# Patient Record
Sex: Female | Born: 1980 | Race: White | Hispanic: No | Smoking: Never smoker
Health system: Southern US, Community
[De-identification: ages and names within clinical notes are randomized; demographics above are authoritative.]

## PROBLEM LIST (undated history)

## (undated) ENCOUNTER — Inpatient Hospital Stay (HOSPITAL_COMMUNITY): Payer: Self-pay

## (undated) DIAGNOSIS — I10 Essential (primary) hypertension: Secondary | ICD-10-CM

## (undated) DIAGNOSIS — F329 Major depressive disorder, single episode, unspecified: Secondary | ICD-10-CM

## (undated) DIAGNOSIS — R002 Palpitations: Secondary | ICD-10-CM

## (undated) DIAGNOSIS — F32A Depression, unspecified: Secondary | ICD-10-CM

## (undated) DIAGNOSIS — R011 Cardiac murmur, unspecified: Secondary | ICD-10-CM

## (undated) DIAGNOSIS — Z9851 Tubal ligation status: Secondary | ICD-10-CM

## (undated) DIAGNOSIS — Z8619 Personal history of other infectious and parasitic diseases: Secondary | ICD-10-CM

## (undated) DIAGNOSIS — D649 Anemia, unspecified: Secondary | ICD-10-CM

## (undated) DIAGNOSIS — Q8789 Other specified congenital malformation syndromes, not elsewhere classified: Secondary | ICD-10-CM

## (undated) HISTORY — DX: Depression, unspecified: F32.A

## (undated) HISTORY — DX: Palpitations: R00.2

## (undated) HISTORY — DX: Anemia, unspecified: D64.9

## (undated) HISTORY — PX: EYE SURGERY: SHX253

## (undated) HISTORY — DX: Other specified congenital malformation syndromes, not elsewhere classified: Q87.89

## (undated) HISTORY — DX: Major depressive disorder, single episode, unspecified: F32.9

## (undated) HISTORY — PX: WISDOM TOOTH EXTRACTION: SHX21

## (undated) HISTORY — DX: Essential (primary) hypertension: I10

## (undated) HISTORY — DX: Personal history of other infectious and parasitic diseases: Z86.19

## (undated) HISTORY — DX: Cardiac murmur, unspecified: R01.1

---
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
## 2006-07-18 ENCOUNTER — Emergency Department (HOSPITAL_COMMUNITY): Admission: EM | Admit: 2006-07-18 | Discharge: 2006-07-18 | Payer: Self-pay | Admitting: Emergency Medicine

## 2008-07-21 ENCOUNTER — Emergency Department (HOSPITAL_BASED_OUTPATIENT_CLINIC_OR_DEPARTMENT_OTHER): Admission: EM | Admit: 2008-07-21 | Discharge: 2008-07-21 | Payer: Self-pay | Admitting: Emergency Medicine

## 2008-07-23 ENCOUNTER — Emergency Department (HOSPITAL_BASED_OUTPATIENT_CLINIC_OR_DEPARTMENT_OTHER): Admission: EM | Admit: 2008-07-23 | Discharge: 2008-07-23 | Payer: Self-pay | Admitting: Emergency Medicine

## 2009-07-27 ENCOUNTER — Inpatient Hospital Stay (HOSPITAL_COMMUNITY): Admission: AD | Admit: 2009-07-27 | Discharge: 2009-07-27 | Payer: Self-pay | Admitting: Obstetrics

## 2009-12-06 ENCOUNTER — Inpatient Hospital Stay (HOSPITAL_COMMUNITY)
Admission: AD | Admit: 2009-12-06 | Discharge: 2009-12-07 | Payer: Self-pay | Source: Home / Self Care | Admitting: Obstetrics & Gynecology

## 2009-12-12 ENCOUNTER — Inpatient Hospital Stay (HOSPITAL_COMMUNITY)
Admission: AD | Admit: 2009-12-12 | Discharge: 2009-12-14 | Payer: Self-pay | Source: Home / Self Care | Admitting: Obstetrics & Gynecology

## 2010-03-27 ENCOUNTER — Encounter: Payer: Self-pay | Admitting: Family

## 2010-10-13 ENCOUNTER — Encounter: Payer: Self-pay | Admitting: Family

## 2010-10-13 ENCOUNTER — Ambulatory Visit (INDEPENDENT_AMBULATORY_CARE_PROVIDER_SITE_OTHER): Payer: PRIVATE HEALTH INSURANCE | Admitting: Family

## 2010-10-13 DIAGNOSIS — S8010XA Contusion of unspecified lower leg, initial encounter: Secondary | ICD-10-CM | POA: Insufficient documentation

## 2010-10-13 DIAGNOSIS — Z0000 Encounter for general adult medical examination without abnormal findings: Secondary | ICD-10-CM

## 2010-10-13 LAB — CBC
HCT: 26.8 % — ABNORMAL LOW (ref 36.0–46.0)
HCT: 35.3 % — ABNORMAL LOW (ref 36.0–46.0)
Hemoglobin: 12.3 g/dL (ref 12.0–15.0)
Hemoglobin: 9.6 g/dL — ABNORMAL LOW (ref 12.0–15.0)
MCHC: 34.9 g/dL (ref 30.0–36.0)
MCHC: 35.8 g/dL (ref 30.0–36.0)
MCV: 92 fL (ref 78.0–100.0)
MCV: 92.5 fL (ref 78.0–100.0)
Platelets: 207 10*3/uL (ref 150–400)
Platelets: 267 10*3/uL (ref 150–400)
RBC: 2.9 MIL/uL — ABNORMAL LOW (ref 3.87–5.11)
RBC: 3.83 MIL/uL — ABNORMAL LOW (ref 3.87–5.11)
RDW: 12.7 % (ref 11.5–15.5)
RDW: 12.9 % (ref 11.5–15.5)
WBC: 10.6 10*3/uL — ABNORMAL HIGH (ref 4.0–10.5)
WBC: 12.7 10*3/uL — ABNORMAL HIGH (ref 4.0–10.5)

## 2010-10-13 LAB — RPR: RPR Ser Ql: NONREACTIVE

## 2010-10-15 ENCOUNTER — Other Ambulatory Visit: Payer: Self-pay | Admitting: Family

## 2010-10-15 LAB — CBC WITH DIFFERENTIAL/PLATELET
Basophils Absolute: 0 10*3/uL (ref 0.0–0.1)
Basophils Relative: 0 % (ref 0–1)
Eosinophils Absolute: 0 10*3/uL (ref 0.0–0.7)
Eosinophils Relative: 0 % (ref 0–5)
HCT: 37.9 % (ref 36.0–46.0)
Hemoglobin: 12.4 g/dL (ref 12.0–15.0)
Lymphocytes Relative: 18 % (ref 12–46)
Lymphs Abs: 1.3 10*3/uL (ref 0.7–4.0)
MCH: 29.7 pg (ref 26.0–34.0)
MCHC: 32.7 g/dL (ref 30.0–36.0)
MCV: 90.7 fL (ref 78.0–100.0)
Monocytes Absolute: 0.6 10*3/uL (ref 0.1–1.0)
Monocytes Relative: 9 % (ref 3–12)
Neutro Abs: 5 10*3/uL (ref 1.7–7.7)
Neutrophils Relative %: 72 % (ref 43–77)
Platelets: 267 10*3/uL (ref 150–400)
RBC: 4.18 MIL/uL (ref 3.87–5.11)
RDW: 13 % (ref 11.5–15.5)
WBC: 7 10*3/uL (ref 4.0–10.5)

## 2010-10-15 LAB — BASIC METABOLIC PANEL
Calcium: 9.4 mg/dL (ref 8.4–10.5)
Glucose, Bld: 87 mg/dL (ref 70–99)
Sodium: 139 mEq/L (ref 135–145)

## 2010-10-15 LAB — HEPATIC FUNCTION PANEL
ALT: 14 U/L (ref 0–35)
AST: 15 U/L (ref 0–37)
Bilirubin, Direct: 0.2 mg/dL (ref 0.0–0.3)

## 2010-10-15 LAB — LIPID PANEL
Cholesterol: 140 mg/dL (ref 0–200)
HDL: 47 mg/dL (ref >39)
Total CHOL/HDL Ratio: 3 Ratio

## 2010-10-15 LAB — TSH: TSH: 0.703 u[IU]/mL (ref 0.350–4.500)

## 2010-10-16 ENCOUNTER — Encounter: Payer: Self-pay | Admitting: Family

## 2010-10-23 NOTE — Letter (Signed)
   [place] [facility] [address] [place], [place]  [address]  [place] Phone: [phone]      October 16, 2010   Jisselle [place] [address] [place], [place] [address]  RE:  LAB RESULTS  Dear  Ms. [place],  The following is an interpretation of your most recent lab tests.  Please take note of any instructions provided or changes to medications that have resulted from your lab work.  ELECTROLYTES:  Good - no changes needed  KIDNEY FUNCTION TESTS:  Good - no changes needed  LIVER FUNCTION TESTS:  Good - no changes needed  LIPID PANEL:  Good - no changes needed Triglyceride: 60   Cholesterol: 140   LDL: 81   HDL: 47   Chol/HDL%:  3.0 Ratio  THYROID STUDIES:  Thyroid studies normal TSH: 0.703     DIABETIC STUDIES:  Excellent - no changes needed Blood Glucose: 87    CBC:  Good - no changes needed   Sincerely Yours,    Lemont Fillers FNP  Appended Document:  Mailed.

## 2010-10-23 NOTE — Assessment & Plan Note (Signed)
Summary: new pt--rm 4   Vital Signs:  Patient profile:   30 year old female Menstrual status:  regular LMP:     09/29/2010 Height:      66 inches Weight:      132.25 pounds BMI:     21.42 Temp:     98.2 degrees F oral Pulse rate:   72 / minute Pulse rhythm:   regular Resp:     12 per minute BP sitting:   108 / 80  (right arm) Cuff size:   regular  Vitals Entered By: Mervin Kung CMA Duncan Dull) (October 13, 2010 1:47 PM) CC: Pt states she is here for a physical; non-fasting. Is Patient Diabetic? No Pain Assessment Patient in pain? no      LMP (date): 09/29/2010     Menstrual Status regular Enter LMP: 09/29/2010 Last PAP Result normal   CC:  Pt states she is here for a physical; non-fasting.Marland Kitchen  History of Present Illness: Patient is here today for a complete physical.  Has not had a PCP, has seen OB/GYN only doctor.   Preventative- Dr.  Steele Sizer.  Last pap smear was last August- sees Dr. Juliene Pina of [facility]-Has always had normal pap smears.  Last tetanus shot, 2003. Patient does regular exercise- boot camp and running.  Diet is "whatever I want."    Complains of bruising on her legs the last 2 weeks.  Shins hurt on/off.  Preventive Screening-Counseling & Management  Alcohol-Tobacco     Alcohol drinks/day: social; 2-3 a week     Alcohol type: beer and wine     Smoking Status: never  Caffeine-Diet-Exercise     Caffeine use/day: 1-2 daily     Does Patient Exercise: yes     Type of exercise: boot camp and running     Exercise (avg: min/session):     Times/week: 3      Drug Use:  no.    Allergies (verified): No Known Drug Allergies  Past History:  Past Medical History: history of chicken pox depression history of heart murmur heart palpatations history of anemia  Past Surgical History: Waardenburg Syndrome; bilateral eye surgery wisdom teeth extraction--30yrs old  Family History: MGF--heart disease MGM--stroke PGF--heart disease father--  a&w mother--living; back problems  2 children--a&W 1 son--deaf 1 daughter--a&w  Social History: Occupation:  stay at home mom mom is Cyndi Bender Divorced- engaged Never Smoked Alcohol use-yes once a week, up to 3 drinks at a time Regular exercise-yes Drug use-no Smoking Status:  never Caffeine use/day:  1-2 daily Does Patient Exercise:  yes Drug Use:  no  Review of Systems       Constitutional: Denies Fever ENT:  Denies nasal congestion or sore throat. Resp: Denies cough CV:  Denies Chest Pain or shortness of breath GI:  Denies nausea or vomitting GU: Denies dysuria Lymphatic: Denies lymphadenopathy Musculoskeletal:  some numbness of left shin last saturday x 2 hours, now resolved. Skin:  Denies Rashes Psychiatric: Denies depression currently, had some problems with depression as a teenager. 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   Neuro: see HPI     Physical Exam  General:  Wide set eyes,  awake, alert, NAD Head:  Frontal grey patch of hair. Normocephalic Eyes:  PERRLA, Right eye is light blue, left eye is brown. Ears:  External ear exam shows no significant lesions or deformities.  Otoscopic examination reveals clear canals, tympanic membranes  are intact bilaterally without bulging, retraction, inflammation or discharge. Hearing is grossly normal bilaterally. Mouth:  Oral mucosa and oropharynx without lesions or exudates.  Teeth in good repair. Neck:  No deformities, masses, or tenderness noted. Breasts:  No mass, nodules, thickening, tenderness, bulging, retraction, inflamation, nipple discharge or skin changes noted.   Lungs:  Normal respiratory effort, chest expands symmetrically. Lungs are clear to auscultation, no crackles or wheezes. Heart:  Normal rate and regular rhythm. S1 and S2 normal without gallop, murmur, click, rub or other extra sounds. Abdomen:  Bowel sounds positive,abdomen soft and  non-tender without masses, organomegaly or hernias noted. Genitalia:  deferred to GYN Pulses:  2+ DP/PT pulses bliaterally. Extremities:  No clubbing, cyanosis, edema, or deformity noted with normal full range of motion of all joints.   Neurologic:  Hearing impaired.  alert & oriented X3 and strength normal in all extremities.   Skin:  Intact without suspicious lesions or rashes.  + bruise noted on left anterior thigh. Cervical Nodes:  No lymphadenopathy noted Psych:  Cognition and judgment appear intact. Alert and cooperative with normal attention span and concentration. No apparent delusions, illusions, hallucinations   Impression & Recommendations:  Problem # 1:  Preventive Health Care (ICD-V70.0) Assessment Comment Only  Immunizations reviewed and up to date.  Reinforced her healthy exercise habits.   Pap up to date with GYN.  Orders: TLB-Lipid Panel (80061-LIPID) TLB-BMP (Basic Metabolic Panel-BMET) (80048-METABOL) TLB-CBC Platelet - w/Differential (85025-CBCD) TLB-Hepatic/Liver Function Pnl (80076-HEPATIC) TLB-TSH (Thyroid Stimulating Hormone) (84443-TSH)  Problem # 2:  CONTUSION, LOWER LEG, LEFT (ICD-924.10) Notes easy bruisbility.  Will check CBC given her history of anemia.   Patient Instructions: 1)  Keep up the good work with the exercise. 2)  Try to make sure that you are eating enough fruits/veggies, and at least 3 servings of dairy each day. 3)  Please go to the lab on the first floor tomorrow morning fasting to complete your blood work. 4)  Follow up in 1 year, sooner if problems or concerns.   Orders Added: 1)  TLB-Lipid Panel [80061-LIPID] 2)  TLB-BMP (Basic Metabolic Panel-BMET) [80048-METABOL] 3)  TLB-CBC Platelet - w/Differential [85025-CBCD] 4)  TLB-Hepatic/Liver Function Pnl [80076-HEPATIC] 5)  TLB-TSH (Thyroid Stimulating Hormone) [84443-TSH] 6)  New Patient 18-39 years [99385]     Preventive Care Screening  Pap Smear:    Date:  03/27/2010     Results:  normal   Last Tetanus Booster:    Date:  07/27/2001    Results:  Historical    Current Allergies (reviewed today): No known allergies

## 2010-11-18 ENCOUNTER — Encounter: Payer: Self-pay | Admitting: Family

## 2010-11-19 ENCOUNTER — Ambulatory Visit (INDEPENDENT_AMBULATORY_CARE_PROVIDER_SITE_OTHER): Payer: PRIVATE HEALTH INSURANCE | Admitting: Family

## 2010-11-19 ENCOUNTER — Encounter: Payer: Self-pay | Admitting: Family

## 2010-11-19 DIAGNOSIS — R0789 Other chest pain: Secondary | ICD-10-CM

## 2010-11-19 DIAGNOSIS — S60229A Contusion of unspecified hand, initial encounter: Secondary | ICD-10-CM

## 2010-11-19 DIAGNOSIS — I998 Other disorder of circulatory system: Secondary | ICD-10-CM

## 2010-11-19 DIAGNOSIS — R58 Hemorrhage, not elsewhere classified: Secondary | ICD-10-CM

## 2010-11-19 DIAGNOSIS — R079 Chest pain, unspecified: Secondary | ICD-10-CM

## 2010-11-19 LAB — PROTIME-INR: Prothrombin Time: 14.3 seconds (ref 11.6–15.2)

## 2010-11-19 NOTE — Progress Notes (Signed)
  Subjective:    Patient ID: Taylor Ferguson, female    DOB: Apr 24, 1981, 30 y.o.   MRN: [id number]  Shortness of Breath Pertinent negatives include no fever.    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  She has been running about 4 days a week for the last 1 year. Notes that she started having chest pain, everything started getting dark, had trouble breathing.    She notes that the chest pain was in the left upper chest and then is spread around the right to her back.  This lasted for about 1 hour.  She had associated shortness of breath off and on 6x a day for about 1 minute-  at a time.  She denies hx of asthma.  Today she reports feeling tired. She notes that she had partial loss of vision- felt like a I was going to pass out.  She stopped running.  She also reports some associated dizziness.  Since Saturday she has run several times without any further chest pain or shortness of breath.  Ecchymosis- notes that she continues to notice bruising on her legs x 2 months.   A sign language interpreter assisted throughout the visit.  Review of Systems  Constitutional: Negative for fever.  Respiratory: Positive for shortness of breath. Negative for cough.   Cardiovascular: Negative for palpitations.  Neurological: Positive for dizziness.       Objective:   Physical Exam  Constitutional: She appears well-developed and well-nourished.  HENT:  Head: Normocephalic.  Right Ear: Tympanic membrane normal.  Left Ear: Tympanic membrane normal.  Eyes: Conjunctivae are normal. Pupils are equal, round, and reactive to light.  Neck: Normal range of motion. Neck supple.  Cardiovascular: Normal rate and regular rhythm.   Pulmonary/Chest: Effort normal and breath sounds normal.  Psychiatric: She has a normal mood and affect. Her behavior is normal.            Assessment & Plan:

## 2010-11-19 NOTE — Patient Instructions (Addendum)
Please complete your lab work on the first floor today. Call us if you develop recurrent chest pain or light headedness. Follow up in 1 month.

## 2010-11-20 DIAGNOSIS — R0789 Other chest pain: Secondary | ICD-10-CM | POA: Insufficient documentation

## 2010-11-20 NOTE — Assessment & Plan Note (Addendum)
EKG WNL, exam WNL, D Dimer negative.  An episode of exercise induced bronchospasm is a possibility.  If recurrent symptoms consider PFTs +/- referral to cardiology.

## 2010-12-18 ENCOUNTER — Ambulatory Visit: Payer: PRIVATE HEALTH INSURANCE | Admitting: Internal Medicine

## 2011-01-20 ENCOUNTER — Encounter: Payer: Self-pay | Admitting: Family

## 2011-01-20 ENCOUNTER — Ambulatory Visit (HOSPITAL_BASED_OUTPATIENT_CLINIC_OR_DEPARTMENT_OTHER)
Admission: RE | Admit: 2011-01-20 | Discharge: 2011-01-20 | Disposition: A | Discharge status: Home / Self Care | Payer: Medicare Other | Source: Ambulatory Visit | Attending: Family | Admitting: Family

## 2011-01-20 ENCOUNTER — Ambulatory Visit (INDEPENDENT_AMBULATORY_CARE_PROVIDER_SITE_OTHER): Payer: PRIVATE HEALTH INSURANCE | Admitting: Family

## 2011-01-20 ENCOUNTER — Telehealth: Payer: Self-pay | Admitting: Family

## 2011-01-20 VITALS — BP 110/72 | HR 66 | Temp 98.2°F | Resp 16 | Ht 65.0 in | Wt 130.0 lb

## 2011-01-20 DIAGNOSIS — S0990XA Unspecified injury of head, initial encounter: Secondary | ICD-10-CM

## 2011-01-20 DIAGNOSIS — R42 Dizziness and giddiness: Secondary | ICD-10-CM | POA: Insufficient documentation

## 2011-01-20 DIAGNOSIS — X58XXXA Exposure to other specified factors, initial encounter: Secondary | ICD-10-CM

## 2011-01-20 DIAGNOSIS — R5383 Other fatigue: Secondary | ICD-10-CM | POA: Insufficient documentation

## 2011-01-20 DIAGNOSIS — J329 Chronic sinusitis, unspecified: Secondary | ICD-10-CM | POA: Insufficient documentation

## 2011-01-20 DIAGNOSIS — R5381 Other malaise: Secondary | ICD-10-CM | POA: Insufficient documentation

## 2011-01-20 MED ORDER — AMOXICILLIN 500 MG PO CAPS: 1000.0000 mg | ORAL_CAPSULE | Freq: Three times a day (TID) | ORAL | Status: DC

## 2011-01-20 NOTE — Telephone Encounter (Signed)
Called patient reviewed net head CT results.  (via Education officer, environmental).  Discussed finding of sinusitis and plan to treat with antibiotics.  She requests that rx go to [facility] on [address].
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
## 2011-01-20 NOTE — Patient Instructions (Signed)
Please complete your CT on the first floor. Call if you develop blurred vision, worsening headache or dizziness or if you develop confusion.

## 2011-01-21 ENCOUNTER — Telehealth: Payer: Self-pay | Admitting: *Deleted

## 2011-01-21 DIAGNOSIS — J329 Chronic sinusitis, unspecified: Secondary | ICD-10-CM | POA: Insufficient documentation

## 2011-01-21 DIAGNOSIS — S0990XA Unspecified injury of head, initial encounter: Secondary | ICD-10-CM | POA: Insufficient documentation

## 2011-01-21 MED ORDER — AMOXICILLIN 500 MG PO CAPS: 1000.0000 mg | ORAL_CAPSULE | Freq: Three times a day (TID) | ORAL | Status: AC

## 2011-01-21 MED ORDER — MELOXICAM 7.5 MG PO TABS: ORAL_TABLET | ORAL | Status: AC

## 2011-01-21 MED ORDER — MELOXICAM 7.5 MG PO TABS: ORAL_TABLET | ORAL | Status: DC

## 2011-01-21 NOTE — Assessment & Plan Note (Signed)
May be cause for dizziness and fatigue.  Will treat with amoxicillin.  See phone note.

## 2011-01-21 NOTE — Telephone Encounter (Signed)
Rx sent to [facility] for amoxicillin, she can also start meloxicam once daily as needed for headache or muscle pain.

## 2011-01-21 NOTE — Telephone Encounter (Signed)
Call placed to patient at [phone], via relay interpreter [phone] ,patient was informed rx sent to pharmacy. She was advise that if there was no improvement in her head or neck pain then she would should seek care in the ER. Patient relayed understanding via interpreter. No additional concerns at this time.

## 2011-01-21 NOTE — Telephone Encounter (Signed)
Patient call via interpreter stating she is still having a lot of head and neck pain. She states the pain is worse on the left side of her neck with a pinching sensation and behind her head on the left side. She would like to know if she could get a rx for pain medication. She is also stating the medication for her sinus the pharmacy did not receive it.
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
## 2011-06-30 ENCOUNTER — Ambulatory Visit (INDEPENDENT_AMBULATORY_CARE_PROVIDER_SITE_OTHER): Payer: PRIVATE HEALTH INSURANCE | Admitting: Family

## 2011-06-30 ENCOUNTER — Encounter: Payer: Self-pay | Admitting: Family

## 2011-06-30 VITALS — BP 110/70 | HR 63 | Temp 98.1°F | Ht 65.0 in | Wt 132.1 lb

## 2011-06-30 DIAGNOSIS — L989 Disorder of the skin and subcutaneous tissue, unspecified: Secondary | ICD-10-CM

## 2011-06-30 DIAGNOSIS — Z23 Encounter for immunization: Secondary | ICD-10-CM

## 2011-06-30 MED ORDER — DOXYCYCLINE HYCLATE 100 MG PO CAPS: 100.0000 mg | ORAL_CAPSULE | Freq: Two times a day (BID) | ORAL | Status: DC

## 2011-06-30 MED ORDER — CEPHALEXIN 500 MG PO CAPS: 500.0000 mg | ORAL_CAPSULE | Freq: Four times a day (QID) | ORAL | Status: AC

## 2011-06-30 NOTE — Progress Notes (Signed)
  Subjective:    Patient ID: Taylor Ferguson, female    DOB: 09/02/1980, 30 y.o.   MRN: [id number]  HPI  Pt presents today with complaint of skin lesions on her back. She reports that she had some tattoo work done on her left shoulder 2 weeks ago and that she has had several red tender lesions since that time.  She wonders if it is a MRSA infection.  She also tells me that she is trying to conceive.   Review of Systems    see HPI  Past Medical History  Diagnosis Date  . History of chicken pox   . Heart murmur   . Heart palpitations   . Anemia     history of  . Depression   . Waardenburg syndrome     History   Social History  . Marital Status: Divorced    Spouse Name: N/A    Number of Children: 2  . Years of Education: N/A   Occupational History  . homemaker    Social History Main Topics  . Smoking status: Never Smoker   . Smokeless tobacco: Never Used  . Alcohol Use: 1.5 oz/week    3 drink(s) per week  . Drug Use: No  . Sexually Active: Not on file   Other Topics Concern  . Not on file   Social History Narrative   Regular exercise:  YesDivorced--engagedMom is Cyndi Bender    Past Surgical History  Procedure Date  . Eye surgery     bilateral -- Waardenburg Syndrome  . Wisdom tooth extraction     Family History  Problem Relation Age of Onset  . Hearing loss Son     deaf  . Stroke Maternal Grandmother   . Heart disease Maternal Grandfather   . Heart disease Paternal Grandfather     No Known Allergies  No current outpatient prescriptions on file prior to visit.    BP 110/70  Pulse 63  Temp(Src) 98.1 F (36.7 C) (Oral)  Ht 5\' 5"  (1.651 m)  Wt 132 lb 1.3 oz (59.911 kg)  BMI 21.98 kg/m2  SpO2 99%  LMP 06/20/2011    Objective:   Physical Exam  Constitutional: She appears well-developed and well-nourished.  Skin: Skin is warm and dry.       3 small tender nodular/erythematous lesions noted on left shoulder.           Assessment &  Plan:

## 2011-06-30 NOTE — Patient Instructions (Addendum)
Call if increased pain, redness swelling, if fever, or if not improved in 2-3 days.

## 2011-07-01 DIAGNOSIS — L989 Disorder of the skin and subcutaneous tissue, unspecified: Secondary | ICD-10-CM | POA: Insufficient documentation

## 2011-07-01 NOTE — Assessment & Plan Note (Signed)
Will plan to treat with Keflex.  Doubt MRSA.  She tells me that she is trying to conceive and keflex is category B.  If symptoms worsen may need to switch to [facility]-  But this is a category C and I am hoping not to have to do that.  She will let me know if symptoms do not improve.

## 2011-09-04 ENCOUNTER — Encounter: Payer: Self-pay | Admitting: Family

## 2011-09-04 ENCOUNTER — Ambulatory Visit (INDEPENDENT_AMBULATORY_CARE_PROVIDER_SITE_OTHER): Payer: Medicare Other | Admitting: Family

## 2011-09-04 VITALS — BP 106/60 | HR 61 | Temp 97.7°F | Resp 16 | Wt 137.0 lb

## 2011-09-04 DIAGNOSIS — R197 Diarrhea, unspecified: Secondary | ICD-10-CM | POA: Insufficient documentation

## 2011-09-04 DIAGNOSIS — N926 Irregular menstruation, unspecified: Secondary | ICD-10-CM

## 2011-09-04 LAB — HCG, SERUM, QUALITATIVE: Preg, Serum: NEGATIVE

## 2011-09-04 NOTE — Assessment & Plan Note (Signed)
We discussed that it is possible that she had an early miscarriage.  Will check HCG today, she is to continue her prenatal vitamin.

## 2011-09-04 NOTE — Progress Notes (Signed)
  Subjective:    Patient ID: Taylor Ferguson, female    DOB: 1981/04/18, 31 y.o.   MRN: [id number]  HPI Ms.  Taylor Ferguson is a 31 yr old female who presents today with chief complaint of irregular menses.  Notes that she had a period 1/15 which was normal and lasted for 5 days. She took a home preg test on 1/15 and it was negative. She is trying to become pregnant.  She reports that she has been using an ovulation kit and she did not show any ovulation between 1/15 and 2/1 when she began to bleed heavy. This bleeding was associated with dull cramping and diarrhea.  After that she has been having dull cramping every single day.  She reports diarrhea 3-4 times a day since 2/3.  She is taking a multivitamin.    Review of Systems See HPI  Past Medical History  Diagnosis Date  . History of chicken pox   . Heart murmur   . Heart palpitations   . Anemia     history of  . Depression   . Waardenburg syndrome     History   Social History  . Marital Status: Divorced    Spouse Name: N/A    Number of Children: 2  . Years of Education: N/A   Occupational History  . homemaker    Social History Main Topics  . Smoking status: Never Smoker   . Smokeless tobacco: Never Used  . Alcohol Use: 1.5 oz/week    3 drink(s) per week  . Drug Use: No  . Sexually Active: Not on file   Other Topics Concern  . Not on file   Social History Narrative   Regular exercise:  YesDivorced--engagedMom is Cyndi Bender    Past Surgical History  Procedure Date  . Eye surgery     bilateral -- Waardenburg Syndrome  . Wisdom tooth extraction     Family History  Problem Relation Age of Onset  . Hearing loss Son     deaf  . Stroke Maternal Grandmother   . Heart disease Maternal Grandfather   . Heart disease Paternal Grandfather     No Known Allergies  No current outpatient prescriptions on file prior to visit.    BP 106/60  Pulse 61  Temp(Src) 97.7 F (36.5 C) (Axillary)  Resp 16  Wt 137 lb 0.6 oz  (62.161 kg)  SpO2 94%  LMP 08/31/2011       Objective:   Physical Exam  Constitutional: She appears well-developed and well-nourished. No distress.  Cardiovascular: Normal rate and regular rhythm.   Pulmonary/Chest: Effort normal and breath sounds normal.  Abdominal: Soft. Bowel sounds are normal.       Mild suprapubic tenderness to palpation.          Assessment & Plan:

## 2011-09-04 NOTE — Assessment & Plan Note (Signed)
?   Related to menses vs viral etiology.  Hopefully will resolve on own.  She is instructed to contact us if symptoms worsen or if no improvement in the next few days.

## 2011-09-04 NOTE — Patient Instructions (Signed)
Please complete your blood work prior to leaving today.  Call if your diarrhea worsens or if it does not improve in the next few days.

## 2011-11-04 ENCOUNTER — Ambulatory Visit (INDEPENDENT_AMBULATORY_CARE_PROVIDER_SITE_OTHER)
Admission: RE | Admit: 2011-11-04 | Discharge: 2011-11-04 | Disposition: A | Discharge status: Home / Self Care | Payer: Medicare Other | Source: Ambulatory Visit | Attending: Family | Admitting: Family

## 2011-11-04 ENCOUNTER — Ambulatory Visit (INDEPENDENT_AMBULATORY_CARE_PROVIDER_SITE_OTHER): Payer: Medicare Other | Admitting: Family

## 2011-11-04 ENCOUNTER — Encounter: Payer: Self-pay | Admitting: Family

## 2011-11-04 ENCOUNTER — Other Ambulatory Visit (HOSPITAL_COMMUNITY)
Admission: RE | Admit: 2011-11-04 | Discharge: 2011-11-04 | Disposition: A | Discharge status: Home / Self Care | Payer: Medicare Other | Source: Ambulatory Visit | Attending: Family | Admitting: Family

## 2011-11-04 ENCOUNTER — Ambulatory Visit (HOSPITAL_BASED_OUTPATIENT_CLINIC_OR_DEPARTMENT_OTHER)
Admission: RE | Admit: 2011-11-04 | Discharge: 2011-11-04 | Disposition: A | Discharge status: Home / Self Care | Payer: Medicare Other | Source: Ambulatory Visit | Attending: Family | Admitting: Family

## 2011-11-04 DIAGNOSIS — N9489 Other specified conditions associated with female genital organs and menstrual cycle: Secondary | ICD-10-CM

## 2011-11-04 DIAGNOSIS — Z0000 Encounter for general adult medical examination without abnormal findings: Secondary | ICD-10-CM | POA: Insufficient documentation

## 2011-11-04 DIAGNOSIS — Z01419 Encounter for gynecological examination (general) (routine) without abnormal findings: Secondary | ICD-10-CM | POA: Insufficient documentation

## 2011-11-04 DIAGNOSIS — N83209 Unspecified ovarian cyst, unspecified side: Secondary | ICD-10-CM

## 2011-11-04 DIAGNOSIS — N858 Other specified noninflammatory disorders of uterus: Secondary | ICD-10-CM

## 2011-11-04 DIAGNOSIS — Z23 Encounter for immunization: Secondary | ICD-10-CM

## 2011-11-04 DIAGNOSIS — N854 Malposition of uterus: Secondary | ICD-10-CM

## 2011-11-04 NOTE — Progress Notes (Signed)
Subjective:    Patient ID: Taylor Ferguson, female    DOB: [birth year], 31 y.o.   MRN: [id number]  HPI  CPX-  Trying to exercise.  Reports healthy diet.  LMP was 3/23. Trying to conceive. Tetanus today.     Review of Systems  HENT: Positive for hearing loss. Negative for congestion.   Eyes: Negative for visual disturbance.  Respiratory: Positive for cough.        Notes dry cough.  X 3 months.  Cardiovascular: Negative for palpitations.  Gastrointestinal: Negative for nausea and vomiting.  Genitourinary: Negative for menstrual problem.  Musculoskeletal: Negative for back pain.  Skin: Negative for rash.  Neurological:       Occasional HA's.  Hematological: Negative for adenopathy.  Psychiatric/Behavioral:       Denies depression/anxiety   Past Medical History  Diagnosis Date  . History of chicken pox   . Heart murmur   . Heart palpitations   . Anemia     history of  . Depression   . Waardenburg syndrome     History   Social History  . Marital Status: Divorced    Spouse Name: N/A    Number of Children: 2  . Years of Education: N/A   Occupational History  . homemaker    Social History Main Topics  . Smoking status: Never Smoker   . Smokeless tobacco: Never Used  . Alcohol Use: 1.5 oz/week    3 drink(s) per week  . Drug Use: No  . Sexually Active: Not on file   Other Topics Concern  . Not on file   Social History Narrative   Regular exercise:  YesDivorced--engagedMom is Cyndi Bender    Past Surgical History  Procedure Date  . Eye surgery     bilateral -- Waardenburg Syndrome  . Wisdom tooth extraction     Family History  Problem Relation Age of Onset  . Hearing loss Son     deaf  . Stroke Maternal Grandmother   . Heart disease Maternal Grandfather   . Heart disease Paternal Grandfather     No Known Allergies  Current Outpatient Prescriptions on File Prior to Visit  Medication Sig Dispense Refill  . PRENATAL VITAMINS PO Take by mouth  daily.        BP 94/62  Pulse 82  Temp(Src) 97.9 F (36.6 C) (Oral)  Resp 16  Wt 137 lb (62.143 kg)  SpO2 98%  LMP 10/17/2011       Objective:   Physical Exam   Physical Exam  Constitutional: She is oriented to person, place, and time. She appears well-developed and well-nourished. No distress.  HENT: Eyes- one blue eye, on brown eye noted.  Eyes are widely spaced. Head: Normocephalic and atraumatic.  Right Ear: Tympanic membrane and ear canal normal.  Left Ear: Tympanic membrane and ear canal normal.  Mouth/Throat: Oropharynx is clear and moist.  Eyes: Pupils are equal, round, and reactive to light. No scleral icterus.  Neck: Normal range of motion. No thyromegaly present.  Cardiovascular: Normal rate and regular rhythm.   No murmur heard. Pulmonary/Chest: Effort normal and breath sounds normal. No respiratory distress. He has no wheezes. She has no rales. She exhibits no tenderness.  Abdominal: Soft. Bowel sounds are normal. He exhibits no distension and no mass. There is no tenderness. There is no rebound and no guarding.  Musculoskeletal: She exhibits no edema.  Lymphadenopathy:    She has no cervical adenopathy.  Neurological: She is alert and  oriented to person, place, and time. She has normal reflexes. She exhibits normal muscle tone. Coordination normal.  Skin: Skin is warm and dry. Multiple tattoos. Psychiatric: She has a normal mood and affect. Her behavior is normal. Judgment and thought content normal.  Breasts: Examined lying Right: Without masses, retractions, discharge or axillary adenopathy.  Left: Without masses, retractions, discharge or axillary adenopathy.  Inguinal/mons: Normal without inguinal adenopathy  External genitalia: Normal  BUS/Urethra/Skene's glands: Normal  Bladder: Normal  Vagina: Normal  Cervix: Normal  Uterus: normal in size, small nodular density noted on left exterior uterus. Midline and mobile  Adnexa/parametria:  Rt: Without  masses or tenderness.  Lt: Without masses or tenderness.  Anus and perineum: Normal           Assessment & Plan:    Assessment & Plan:

## 2011-11-04 NOTE — Assessment & Plan Note (Signed)
Pt is encouraged to continue healthy eating and exercise.  Tdap given today, pap performed.

## 2011-11-04 NOTE — Patient Instructions (Signed)
Please complete your blood work prior to leaving.  Follow up in 1 year, sooner if problems/concerns. 

## 2011-11-04 NOTE — Assessment & Plan Note (Signed)
?   Small external uterine fibroid noted on exam. Will obtain ultrasound to further evaluate.

## 2011-11-05 ENCOUNTER — Telehealth: Payer: Self-pay | Admitting: Family

## 2011-11-05 LAB — CBC WITH DIFFERENTIAL/PLATELET
Basophils Relative: 1 % (ref 0–1)
Eosinophils Absolute: 0.1 10*3/uL (ref 0.0–0.7)
Hemoglobin: 12.8 g/dL (ref 12.0–15.0)
MCH: 29.6 pg (ref 26.0–34.0)
MCHC: 33.2 g/dL (ref 30.0–36.0)
Monocytes Absolute: 0.7 10*3/uL (ref 0.1–1.0)
Monocytes Relative: 14 % — ABNORMAL HIGH (ref 3–12)
Neutrophils Relative %: 56 % (ref 43–77)

## 2011-11-05 LAB — BASIC METABOLIC PANEL WITH GFR
BUN: 18 mg/dL (ref 6–23)
Creat: 0.67 mg/dL (ref 0.50–1.10)
GFR, Est African American: >89 mL/min
Glucose, Bld: 85 mg/dL (ref 70–99)
Potassium: 4.2 mEq/L (ref 3.5–5.3)

## 2011-11-05 NOTE — Telephone Encounter (Signed)
Please call pt and let her know that her ultrasound is normal. Thanks (fyi, pt is hearing impaired)

## 2011-11-05 NOTE — Telephone Encounter (Signed)
Call placed to patient at [phone], she was informed per Sandford Craze instructions.

## 2011-11-05 NOTE — Telephone Encounter (Signed)
Patient would like the results to her ultrasound.

## 2011-11-06 ENCOUNTER — Telehealth: Payer: Self-pay | Admitting: Family

## 2011-11-06 LAB — URINALYSIS, ROUTINE W REFLEX MICROSCOPIC
Bilirubin Urine: NEGATIVE
Glucose, UA: NEGATIVE mg/dL
Hgb urine dipstick: NEGATIVE
Protein, ur: NEGATIVE mg/dL

## 2011-11-06 LAB — URINALYSIS, MICROSCOPIC ONLY: Casts: NONE SEEN

## 2012-07-08 ENCOUNTER — Ambulatory Visit (INDEPENDENT_AMBULATORY_CARE_PROVIDER_SITE_OTHER): Payer: Medicare Other | Admitting: Family

## 2012-07-08 ENCOUNTER — Encounter: Payer: Self-pay | Admitting: Family

## 2012-07-08 VITALS — BP 110/78 | HR 89 | Temp 98.0°F | Resp 16 | Wt 145.0 lb

## 2012-07-08 DIAGNOSIS — N912 Amenorrhea, unspecified: Secondary | ICD-10-CM

## 2012-07-08 DIAGNOSIS — Z331 Pregnant state, incidental: Secondary | ICD-10-CM

## 2012-07-08 DIAGNOSIS — Z3490 Encounter for supervision of normal pregnancy, unspecified, unspecified trimester: Secondary | ICD-10-CM | POA: Insufficient documentation

## 2012-07-08 LAB — POCT URINE PREGNANCY: Preg Test, Ur: POSITIVE

## 2012-07-08 NOTE — Assessment & Plan Note (Signed)
She is on prenatal vitamin. Keep upcoming apt with ob, avoid alcohol.

## 2012-07-08 NOTE — Progress Notes (Signed)
  Subjective:    Patient ID: Taylor Ferguson, female    DOB: Dec 06, 1980, 31 y.o.   MRN: [id number]  HPI  LMP 11/11.  + breast tenderness/nausea/vomitting.  She has taken a home pregnancy test which was positive.  She sees [facility].  She has apt next month.  She is taking prenatal vitamins.  She keeps trying to take them.    Review of Systems    see hpi  Past Medical History  Diagnosis Date  . History of chicken pox   . Heart murmur   . Heart palpitations   . Anemia     history of  . Depression   . Waardenburg syndrome     History   Social History  . Marital Status: Divorced    Spouse Name: N/A    Number of Children: 2  . Years of Education: N/A   Occupational History  . homemaker    Social History Main Topics  . Smoking status: Never Smoker   . Smokeless tobacco: Never Used  . Alcohol Use: 1.5 oz/week    3 drink(s) per week  . Drug Use: No  . Sexually Active: Not on file   Other Topics Concern  . Not on file   Social History Narrative   Regular exercise:  YesDivorced--engagedMom is Taylor Ferguson    Past Surgical History  Procedure Date  . Eye surgery     bilateral -- Waardenburg Syndrome  . Wisdom tooth extraction     Family History  Problem Relation Age of Onset  . Hearing loss Son     deaf  . Stroke Maternal Grandmother   . Heart disease Maternal Grandfather   . Heart disease Paternal Grandfather     No Known Allergies  Current Outpatient Prescriptions on File Prior to Visit  Medication Sig Dispense Refill  . PRENATAL VITAMINS PO Take by mouth daily.        BP 110/78  Pulse 89  Temp 98 F (36.7 C) (Oral)  Resp 16  Wt 145 lb (65.772 kg)  SpO2 99%  LMP 06/06/2012    Objective:   Physical Exam  Constitutional: She is oriented to person, place, and time. She appears well-developed and well-nourished. No distress.  HENT:  Head: Normocephalic and atraumatic.  Cardiovascular: Normal rate and regular rhythm.   No murmur  heard. Pulmonary/Chest: Effort normal and breath sounds normal. No respiratory distress. She has no wheezes.  Abdominal: Soft. Bowel sounds are normal. She exhibits no distension and no mass. There is no tenderness. There is no rebound and no guarding.  Musculoskeletal: She exhibits no edema.  Neurological: She is alert and oriented to person, place, and time.  Skin: Skin is warm and dry.  Psychiatric: She has a normal mood and affect. Her behavior is normal. Judgment and thought content normal.          Assessment & Plan:

## 2012-07-27 NOTE — L&D Delivery Note (Signed)
Pt had pit aug started for inadequate labor. She then progressed along a normal labor curve and pushed one time. She had a SVD of one live viable white female infant in the ROA position over an intact perineum. Placenta - S/I, EBL-400cc. Baby to NBN.

## 2012-07-30 ENCOUNTER — Encounter: Payer: Self-pay | Admitting: Family

## 2012-08-01 ENCOUNTER — Other Ambulatory Visit: Payer: Self-pay

## 2012-08-22 ENCOUNTER — Ambulatory Visit: Payer: Medicare Other | Admitting: Cardiology

## 2012-08-26 ENCOUNTER — Encounter: Payer: Self-pay | Admitting: Cardiology

## 2012-08-26 ENCOUNTER — Ambulatory Visit (INDEPENDENT_AMBULATORY_CARE_PROVIDER_SITE_OTHER): Payer: Medicare Other | Admitting: Cardiology

## 2012-08-26 VITALS — BP 108/76 | HR 83 | Ht 66.0 in | Wt 141.0 lb

## 2012-08-26 DIAGNOSIS — R002 Palpitations: Secondary | ICD-10-CM

## 2012-08-26 LAB — BASIC METABOLIC PANEL
BUN: 12 mg/dL (ref 6–23)
Calcium: 8.8 mg/dL (ref 8.4–10.5)
Chloride: 102 mEq/L (ref 96–112)
Creatinine, Ser: 0.5 mg/dL (ref 0.4–1.2)

## 2012-08-26 LAB — TSH: TSH: 0.23 u[IU]/mL — ABNORMAL LOW (ref 0.35–5.50)

## 2012-08-26 NOTE — Patient Instructions (Addendum)
The current medical regimen is effective;  continue present plan and medications.  Please have blood work today  (TSH, BMP)  Your physician has recommended that you wear an event monitor for 21 days. Event monitors are medical devices that record the heart's electrical activity. Doctors most often [place] these monitors to diagnose arrhythmias. Arrhythmias are problems with the speed or rhythm of the heartbeat. The monitor is a small, portable device. You can wear one while you do your normal daily activities. This is usually used to diagnose what is causing palpitations/syncope (passing out).  Your physician has requested that you have an echocardiogram. Echocardiography is a painless test that uses sound waves to create images of your heart. It provides your doctor with information about the size and shape of your heart and how well your heart's chambers and valves are working. This procedure takes approximately one hour. There are no restrictions for this procedure.  Follow up after testing has been completed with Dr Antoine Poche

## 2012-08-26 NOTE — Progress Notes (Signed)
HPI The patient presents for evaluation of palpitations. She is now [redacted] weeks pregnant with her third pregnancy. She had no complications with the others. 10 years ago she had palpitations and apparently was taking medications which she stopped on her own after about 3 years. She hadn't been particularly bothered by palpitations until recently. She started noticing these about every other day. At times she says her heart rate is in the 170s. She thinks it is irregular. It happens about every other day. It may last for about 5 minutes. He sits down. On its own. She gets a pinching left arm numbness and mild shortness of breath with it. She cannot bring this on with activities and she does exercise. She otherwise describes no chest pressure, neck or arm discomfort. She does get somewhat weak but hasn't had any syncope with this. She has no shortness of breath, PND or orthopnea. She has had nausea with her pregnancy.  No Known Allergies  Current Outpatient Prescriptions  Medication Sig Dispense Refill  . Doxylamine-Pyridoxine (DICLEGIS) 10-10 MG TBEC Take as needed for nausea      . PRENATAL VITAMINS PO Take by mouth daily.        Past Medical History  Diagnosis Date  . History of chicken pox   . Heart murmur   . Heart palpitations   . Anemia     history of  . Depression   . Waardenburg syndrome     Past Surgical History  Procedure Date  . Eye surgery     bilateral -- Waardenburg Syndrome  . Wisdom tooth extraction     Family History  Problem Relation Age of Onset  . Hearing loss Son     deaf  . Stroke Maternal Grandmother   . Heart disease Maternal Grandfather   . Heart disease Paternal Grandfather     History   Social History  . Marital Status: Divorced    Spouse Name: N/A    Number of Children: 2  . Years of Education: N/A   Occupational History  . homemaker    Social History Main Topics  . Smoking status: Never Smoker   . Smokeless tobacco: Never Used  .  Alcohol Use: 1.5 oz/week    3 drink(s) per week  . Drug Use: No  . Sexually Active: Not on file   Other Topics Concern  . Not on file   Social History Narrative   Regular exercise:  YesDivorced--engagedMom is Dois Davenport Puckett    ROS:  Positive for occasional headaches, nausea with pregnancy, 13 tattoos. Otherwise as stated in the HPI and negative for all other systems.  PHYSICAL EXAM BP 108/76  Pulse 83  Ht 5\' 6"  (1.676 m)  Wt 141 lb (63.957 kg)  BMI 22.76 kg/m2  SpO2 92% GENERAL:  Well appearing HEENT:  Pupils equal round and reactive, fundi not visualized, oral mucosa unremarkable, she has one brown eye and one blue eye, she has a broad nose NECK:  No jugular venous distention, waveform within normal limits, carotid upstroke brisk and symmetric, no bruits, no thyromegaly LYMPHATICS:  No cervical, inguinal adenopathy LUNGS:  Clear to auscultation bilaterally BACK:  No CVA tenderness CHEST:  Unremarkable HEART:  PMI not displaced or sustained,S1 and S2 within normal limits, no S3, no S4, no clicks, no rubs, no murmurs ABD:  Flat, positive bowel sounds normal in frequency in pitch, no bruits, no rebound, no guarding, no midline pulsatile mass, no hepatomegaly, no splenomegaly EXT:  2 plus pulses  throughout, no edema, no cyanosis no clubbing SKIN:  No rashes no nodules NEURO:  Cranial nerves II through XII grossly intact, motor grossly intact throughout PSYCH:  Cognitively intact, oriented to person place and time   EKG:   Sinus rhythm, rate 83, axis within normal limits, intervals within normal limits, no acute ST-T wave changes.  ASSESSMENT AND PLAN  Palpitations - The patient is describing these as above. I will place a 21 day event monitor. I will order an echocardiogram. If she has not had recent electrolytes and a TSH these will be obtained. We discussed the potential symptomatic treatment of these but she would prefer to avoid medications if possible. Further evaluation  and management will be based on these results.

## 2012-09-02 ENCOUNTER — Other Ambulatory Visit: Payer: Self-pay | Admitting: *Deleted

## 2012-09-02 DIAGNOSIS — N926 Irregular menstruation, unspecified: Secondary | ICD-10-CM

## 2012-09-05 ENCOUNTER — Other Ambulatory Visit (INDEPENDENT_AMBULATORY_CARE_PROVIDER_SITE_OTHER): Payer: Medicare Other

## 2012-09-05 DIAGNOSIS — N926 Irregular menstruation, unspecified: Secondary | ICD-10-CM

## 2012-09-05 LAB — OB RESULTS CONSOLE HIV ANTIBODY (ROUTINE TESTING): HIV: NONREACTIVE

## 2012-09-05 LAB — BASIC METABOLIC PANEL
CO2: 25 mEq/L (ref 19–32)
Calcium: 8.4 mg/dL (ref 8.4–10.5)
Chloride: 103 mEq/L (ref 96–112)
Creatinine, Ser: 0.5 mg/dL (ref 0.4–1.2)
Glucose, Bld: 132 mg/dL — ABNORMAL HIGH (ref 70–99)

## 2012-09-07 ENCOUNTER — Telehealth: Payer: Self-pay | Admitting: *Deleted

## 2012-09-07 ENCOUNTER — Ambulatory Visit (HOSPITAL_COMMUNITY): Payer: Medicare Other | Attending: Cardiovascular Disease | Admitting: Radiology

## 2012-09-07 ENCOUNTER — Encounter (INDEPENDENT_AMBULATORY_CARE_PROVIDER_SITE_OTHER): Payer: Medicare Other

## 2012-09-07 DIAGNOSIS — R002 Palpitations: Secondary | ICD-10-CM | POA: Insufficient documentation

## 2012-09-07 DIAGNOSIS — R0602 Shortness of breath: Secondary | ICD-10-CM

## 2012-09-07 DIAGNOSIS — O99891 Other specified diseases and conditions complicating pregnancy: Secondary | ICD-10-CM | POA: Insufficient documentation

## 2012-09-07 NOTE — Progress Notes (Signed)
Echocardiogram performed.  

## 2012-09-07 NOTE — Telephone Encounter (Signed)
Event monitor placed on Pt 09/07/12 TK

## 2012-09-10 ENCOUNTER — Other Ambulatory Visit: Payer: Self-pay

## 2012-09-13 ENCOUNTER — Other Ambulatory Visit: Payer: Self-pay | Admitting: *Deleted

## 2012-09-13 DIAGNOSIS — E876 Hypokalemia: Secondary | ICD-10-CM

## 2012-09-13 MED ORDER — POTASSIUM CHLORIDE CRYS ER 20 MEQ PO TBCR: EXTENDED_RELEASE_TABLET | ORAL | Status: DC

## 2012-09-14 ENCOUNTER — Telehealth: Payer: Self-pay | Admitting: *Deleted

## 2012-09-14 NOTE — Telephone Encounter (Signed)
Pt states some SOB and feeling jittery.  Dr. Patty Sermons, DOD reviewed E-Cardio strips and suggested that pt try relaxation techniques and holding her breath to try and break rhythm.  Pt agreed.

## 2012-09-14 NOTE — Telephone Encounter (Signed)
Pt states that Dr. Antoine Poche called her last pm and ordered Potassium.

## 2012-09-14 NOTE — Telephone Encounter (Signed)
Received call from e-cardio, they show the pt is having SVT with rate of 166. She is having some symptoms. [facility] is going to fax the strips. Left message for pt to call the office.

## 2012-09-14 NOTE — Telephone Encounter (Signed)
Attempted to contact pt again, left a message to call back.

## 2012-09-15 ENCOUNTER — Encounter: Payer: Self-pay | Admitting: Family

## 2012-09-15 ENCOUNTER — Telehealth: Payer: Self-pay | Admitting: Family

## 2012-09-15 DIAGNOSIS — E0590 Thyrotoxicosis, unspecified without thyrotoxic crisis or storm: Secondary | ICD-10-CM

## 2012-09-15 NOTE — Telephone Encounter (Signed)
See my chart message

## 2012-09-16 ENCOUNTER — Telehealth: Payer: Self-pay | Admitting: *Deleted

## 2012-09-16 NOTE — Telephone Encounter (Signed)
Received e cardio strip from fax - strips demonstrate rapid beat with HR of 166 WHICH OCCURRED 2/16 AT 9:30 AM.  This information as previously been documented and addressed. Please see prior telephone note

## 2012-09-16 NOTE — Telephone Encounter (Signed)
Received phone call from e-cardio stating pt in SVT with HR of 165.  They are faxing info over.

## 2012-09-18 NOTE — Telephone Encounter (Signed)
She needs a follow up appt this week.

## 2012-09-20 ENCOUNTER — Telehealth: Payer: Self-pay | Admitting: Cardiology

## 2012-09-20 ENCOUNTER — Encounter: Payer: Self-pay | Admitting: Cardiology

## 2012-09-20 ENCOUNTER — Ambulatory Visit (INDEPENDENT_AMBULATORY_CARE_PROVIDER_SITE_OTHER): Payer: Medicare Other | Admitting: Cardiology

## 2012-09-20 ENCOUNTER — Other Ambulatory Visit (INDEPENDENT_AMBULATORY_CARE_PROVIDER_SITE_OTHER): Payer: Medicare Other

## 2012-09-20 VITALS — BP 119/86 | HR 121 | Ht 66.0 in | Wt 143.0 lb

## 2012-09-20 DIAGNOSIS — E876 Hypokalemia: Secondary | ICD-10-CM

## 2012-09-20 DIAGNOSIS — R002 Palpitations: Secondary | ICD-10-CM

## 2012-09-20 MED ORDER — METOPROLOL TARTRATE 25 MG PO TABS: ORAL_TABLET | ORAL | Status: DC

## 2012-09-20 NOTE — Telephone Encounter (Signed)
Patient has an appointment with Dr.Hochrein today at 4 pm.

## 2012-09-20 NOTE — Telephone Encounter (Signed)
New Prob    Reporting series EKG: in SVT 166 beats per min. Call back if questions come up.

## 2012-09-20 NOTE — Progress Notes (Signed)
HPI The patient presents for evaluation of palpitations. She is now [redacted] weeks pregnant with her third pregnancy. After the first visit I did do an event monitor which she is currently wearing. This demonstrated frequent bouts of apparent atrial tachycardia. She's having is a couple of times if she's home in comfortable any given day but several times if she's out and active. I checked orthostatics today and she's not particularly orthostatic.  However, her heart rate did go up consistent with a possible postural orthostatic tachycardia.  She's not describing these symptoms. She does get somewhat lightheaded and mildly short of breath with these episodes. She's not had any frank syncope. She denies any chest pressure, neck or arm discomfort. She's not had PND or orthopnea. She was noted to have a mildly low potassium, sodium and TSH recently. She's going to see an endocrinologist. She had an echocardiogram which was normal.  No Known Allergies  Current Outpatient Prescriptions  Medication Sig Dispense Refill  . Doxylamine-Pyridoxine (DICLEGIS) 10-10 MG TBEC Take as needed for nausea      . potassium chloride SA (K-DUR,KLOR-CON) 20 MEQ tablet Take 2 tablets by mouth for 2 days - hold other tablets for future need  30 tablet  1  . PRENATAL VITAMINS PO Take by mouth daily.      . metoprolol tartrate (LOPRESSOR) 25 MG tablet TAKE 1/2 TABLET TWICE DAILY  60 tablet  11   No current facility-administered medications for this visit.    Past Medical History  Diagnosis Date  . History of chicken pox   . Heart murmur   . Heart palpitations   . Anemia     history of  . Depression   . Waardenburg syndrome     Past Surgical History  Procedure Laterality Date  . Eye surgery      bilateral -- Waardenburg Syndrome  . Wisdom tooth extraction      Family History  Problem Relation Age of Onset  . Hearing loss Son     deaf  . Stroke Maternal Grandmother   . CAD Maternal Grandfather 28  . CAD  Paternal Grandfather 62    History   Social History  . Marital Status: Divorced    Spouse Name: N/A    Number of Children: 2  . Years of Education: N/A   Occupational History  . homemaker    Social History Main Topics  . Smoking status: Never Smoker   . Smokeless tobacco: Never Used  . Alcohol Use: 1.5 oz/week    3 drink(s) per week  . Drug Use: No  . Sexually Active: Not on file   Other Topics Concern  . Not on file   Social History Narrative   Regular exercise:  Yes   Divorced--engaged   Mom is Cyndi Bender    ROS:  Positive for occasional headaches, nausea with pregnancy, 13 tattoos. Otherwise as stated in the HPI and negative for all other systems.  PHYSICAL EXAM BP 119/86  Pulse 121  Ht 5\' 6"  (1.676 m)  Wt 143 lb (64.864 kg)  BMI 23.09 kg/m2  SpO2 99% GENERAL:  Well appearing HEENT:  Pupils equal round and reactive, fundi not visualized, oral mucosa unremarkable, she has one brown eye and one blue eye, she has a broad nose NECK:  No jugular venous distention, waveform within normal limits, carotid upstroke brisk and symmetric, no bruits, no thyromegaly LYMPHATICS:  No cervical, inguinal adenopathy LUNGS:  Clear to auscultation bilaterally BACK:  No CVA  tenderness CHEST:  Unremarkable HEART:  PMI not displaced or sustained,S1 and S2 within normal limits, no S3, no S4, no clicks, no rubs, no murmurs ABD:  Flat, positive bowel sounds normal in frequency in pitch, no bruits, no rebound, no guarding, no midline pulsatile mass, no hepatomegaly, no splenomegaly EXT:  2 plus pulses throughout, no edema, no cyanosis no clubbing SKIN:  No rashes no nodules NEURO:  Cranial nerves II through XII grossly intact, motor grossly intact throughout PSYCH:  Cognitively intact, oriented to person place and time   ASSESSMENT AND PLAN  Palpitations - Her rhythm strip seems to integrate paroxysmal atrial tachycardia. She's having frequent episodes of this. I did draw a T3  and T4 as her TSH was slightly reduced. I'm going to start with a low dose beta blocker. We discussed the risk of any medication during pregnancy. Beta blockers have been associated with low birthweight and fetal bradycardia. However, she is significantly symptomatic and having frequent episodes. She will also follow with an endocrinologist. I will see her back in about one month to discuss further.  Of note while she had some evidence of possible POTS the rhythm strip would indicate a possible more abrupt onset and offset and I would suspect with this. However, we did discuss hydration and salt. She may benefit from compression stockings.

## 2012-09-20 NOTE — Patient Instructions (Addendum)
Your physician recommends that you schedule a follow-up appointment in: ONE MONTH WITH DR [facility]  START METOPROLOL TART 25 MG TAKE ONE HALF TABLET TWICE DAILY

## 2012-09-21 LAB — BASIC METABOLIC PANEL
CO2: 22 mEq/L (ref 19–32)
Calcium: 8.7 mg/dL (ref 8.4–10.5)
Creatinine, Ser: 0.4 mg/dL (ref 0.4–1.2)
GFR: 192.52 mL/min (ref >60.00)

## 2012-09-21 LAB — T4, FREE: Free T4: 0.83 ng/dL (ref 0.60–1.60)

## 2012-09-21 LAB — T3, FREE: T3, Free: 3.6 pg/mL (ref 2.3–4.2)

## 2012-09-22 ENCOUNTER — Ambulatory Visit (INDEPENDENT_AMBULATORY_CARE_PROVIDER_SITE_OTHER): Payer: Medicare Other | Admitting: Internal Medicine

## 2012-09-22 ENCOUNTER — Encounter: Payer: Self-pay | Admitting: Cardiology

## 2012-09-22 ENCOUNTER — Encounter: Payer: Self-pay | Admitting: Internal Medicine

## 2012-09-22 VITALS — BP 132/80 | HR 84 | Ht 66.0 in | Wt 146.0 lb

## 2012-09-22 DIAGNOSIS — R7989 Other specified abnormal findings of blood chemistry: Secondary | ICD-10-CM | POA: Insufficient documentation

## 2012-09-22 DIAGNOSIS — E0590 Thyrotoxicosis, unspecified without thyrotoxic crisis or storm: Secondary | ICD-10-CM

## 2012-09-22 NOTE — Progress Notes (Signed)
Subjective:     Patient ID: Taylor Ferguson, female   DOB: [birth year], 32 y.o.   MRN: [id number]  HPI Taylor Ferguson is a pleasant 32 y/o woman referred by PCP, Lemont Fillers., NP, for dx and treatment of low TSH. She is now pregnant, this is her third pregnancy.   She was not told she had thyroid problems in the past. Patient's TSH was checked due to increased palpitations, and this returned low at 0.23 (08/26/2012). Previous values have been 0.61 (10 months ago), and 0.70 (1 year ago). She was seen by Dr. Antoine Poche with cardiology for her palpitations, and found that these are paroxysmal atrial tachycardia episodes. He checked her free T3 and a free T4 that returned normal at 3.6 (2.3-4.2), and 0.83 (0.6-1.6) on 09/20/2012. Other findings: low potassium, at 3.2, which normalized after supplementation to 3.6. She tells me she had ~5 episodes of N/V a day, improved now, but still vomits once a day in am. She was started on a beta blocker, but she did not pick it up yet. She feels that her palpitations are a little bit better.   She denies weight loss before the pregnancy, also her menstrual cycles were regular. She did have palpitations before, and was previously on an event monitor. No herbal supplements, only takes prenatal vitamins. No kelp or seaweed in diet. No corticosteroid use.   No FH of thyroid problems, no thyroid cancer. No autoimmune diseases in her family. 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 She also has a heart murmur, history of anemia, depression.  Review of Systems Constitutional: no weight gain/loss, no fatigue, no subjective hyperthermia/hypothermia Eyes: no blurry vision, no xerophthalmia ENT: no sore throat, no nodules palpated in throat, no dysphagia/odynophagia, no hoarseness Cardiovascular: no CP/SOB/+ palpitations/leg swelling Respiratory: no cough/SOB Gastrointestinal: no N/V/D/C Musculoskeletal:  no muscle/joint aches Skin: no rashes Neurological: no tremors/numbness/tingling/dizziness Psychiatric: no depression/anxiety  Past Medical History  Diagnosis Date  . History of chicken pox   . Heart murmur   . Heart palpitations   . Anemia     history of  . Depression   . Waardenburg syndrome    Past Surgical History  Procedure Laterality Date  . Eye surgery      bilateral -- Waardenburg Syndrome  . Wisdom tooth extraction     History   Social History  . Marital Status: Divorced    Spouse Name: N/A    Number of Children: 2  . Years of Education: N/A   Occupational History  . homemaker    Social History Main Topics  . Smoking status: Never Smoker   . Smokeless tobacco: Never Used  . Alcohol Use: 1.5 oz/week    3 drink(s) per week  . Drug Use: No  . Sexually Active: Not on file   Other Topics Concern  . Not on file   Social History Narrative   Regular exercise:  Yes   Divorced--engaged   Mom is Cyndi Bender   Current Outpatient Prescriptions on File Prior to Visit  Medication Sig Dispense Refill  . Doxylamine-Pyridoxine (DICLEGIS) 10-10 MG TBEC Take as needed for nausea      . metoprolol tartrate (LOPRESSOR) 25 MG tablet TAKE 1/2 TABLET TWICE DAILY  60 tablet  11  . potassium chloride SA (K-DUR,KLOR-CON) 20 MEQ tablet Take 2 tablets by mouth for 2 days - hold other tablets for future need  30 tablet  1  .  PRENATAL VITAMINS PO Take by mouth daily.       No current facility-administered medications on file prior to visit.   No Known Allergies  Family History  Problem Relation Age of Onset  . Hearing loss Son     deaf  . Stroke Maternal Grandmother   . CAD Maternal Grandfather 37  . CAD Paternal Grandfather 60   Objective:   Physical Exam BP 132/80  Pulse 84  Ht 5\' 6"  (1.676 m)  Wt 146 lb (66.225 kg)  BMI 23.58 kg/m2  SpO2 98% Wt Readings from Last 3 Encounters:  09/22/12 146 lb (66.225 kg)  09/20/12 143 lb (64.864 kg)  08/26/12 141 lb  (63.957 kg)   Constitutional: normal weight, in NAD, she has hypertelorism, low-set ears, one eye blue and the other brown, and she has a lock of hair that is nonpigmented Eyes: PERRLA, no exophthalmos ENT: moist mucous membranes, no thyromegaly, no cervical lymphadenopathy Cardiovascular:  Tachycardia, RR, No MRG Respiratory: CTA B Gastrointestinal: abdomen soft, NT, ND, BS+ Musculoskeletal: no deformities, strength intact in all 4 Skin: moist, warm, no rashes Neurological: very slight tremor with outstretched hands, DTR normal in all 4  Assessment:     1. Subclinical hyperthyroidism  - [redacted] weeks pregnant    Plan:  The patient had a low TSH measured 12 weeks into her pregnancy, when she had frequent nausea and vomiting episodes and a low potassium. She had normal free T4 and free T3 checked 16 weeks into her pregnancy. She had palpitations which are little improved, however, her heart rate is still up. She was started on metoprolol but did not pick it up from the pharmacy yet.  - I believe that her low TSH has been caused by her high beta hCG (which is highest around week 12). This is usually due to stimulation of thyroid by beta hCG as the TSH and the hCG have a common alpha subunit and may stimulate the same receptors - subclinical hyperthyroidism is a normal finding in the first trimester of pregnancy and beta blocker to control her symptoms is usually enough for management - I would not repeat a TSH now, even though it has been a month since the last check, because her free T3 and free T4 were normal 2 days ago, and a low TSH value would not change our management - I advised her to have another set of thyroid hormones rechecked by her PCP toward the middle of her pregnancy, and a repeat about one month after she gives birth, to make sure that her thyroid function remains normal - patient agrees to plan - I will not schedule a followup appointment, but will see her prn

## 2012-09-22 NOTE — Patient Instructions (Addendum)
I believe your low TSH is caused by high beta HCG (the pregnancy hormone). Please start your Lopressor today as advised by Dr. Antoine Poche. Please have your PCP recheck your thyroid tests once towards the middle of your pregnancy and about a month after you give birth.

## 2012-10-20 ENCOUNTER — Ambulatory Visit: Payer: Medicare Other | Admitting: Cardiology

## 2012-10-24 LAB — OB RESULTS CONSOLE ABO/RH: RH Type: NEGATIVE

## 2012-10-31 ENCOUNTER — Ambulatory Visit (INDEPENDENT_AMBULATORY_CARE_PROVIDER_SITE_OTHER): Payer: Medicare Other | Admitting: Cardiology

## 2012-10-31 ENCOUNTER — Encounter: Payer: Self-pay | Admitting: Cardiology

## 2012-10-31 VITALS — BP 104/66 | HR 80 | Ht 66.0 in | Wt 152.0 lb

## 2012-10-31 DIAGNOSIS — R002 Palpitations: Secondary | ICD-10-CM

## 2012-10-31 NOTE — Progress Notes (Signed)
HPI The patient presents for evaluation of palpitations. She is now 21.[redacted] weeks pregnant with her third pregnancy. She reports her pregnancy has been progressing well and she has had no reports of growth restriction from OB/gyn Dr. Claiborne Billings. After the first visit she had an event monitor placed which demonstrated frequent bouts of apparent atrial tachycardia. She was noted to have a mildly low potassium, sodium and TSH at that time. Her potassium was repleted at that time and was likely due to emesis related to pregnancy. She was sent to her endocrinologist for low TSH and was found to have normal T3, T4. This was attributed to be subclinical hyperthyroidism which is considered a normal finding in 1st trimester per endocrine. She has planned follow up with thyroid function studies with PCP later in pregnancy and postpregnancy. She had an echocardiogram (09/07/12) which was normal.  Palpitations are now much improved. She is experiencing palpitations several minutes once a week now, previously almost everyday and at times multiple times per day.   No Known Allergies  Current Outpatient Prescriptions  Medication Sig Dispense Refill  . Doxylamine-Pyridoxine (DICLEGIS) 10-10 MG TBEC Take as needed for nausea      . metoprolol tartrate (LOPRESSOR) 25 MG tablet TAKE 1/2 TABLET TWICE DAILY  60 tablet  11  . potassium chloride SA (K-DUR,KLOR-CON) 20 MEQ tablet Take 2 tablets by mouth for 2 days - hold other tablets for future need  30 tablet  1  . PRENATAL VITAMINS PO Take by mouth daily.       No current facility-administered medications for this visit.    Past Medical History  Diagnosis Date  . History of chicken pox   . Heart murmur   . Heart palpitations   . Anemia     history of  . Depression   . Waardenburg syndrome    ROS:  Feeling tired but hard to say if that is due to pregnancy or medicine. Was dizzy for first 2 days on metoprolol but that has resolved completely. Occasional  lightheadedness if stands very rapidly. Otherwise as stated in the HPI and negative for all other systems.  PHYSICAL EXAM LMP 06/06/2012 GENERAL:  Well appearing HEENT:  Pupils equal round and reactive, fundi not visualized, oral mucosa unremarkable, she has one brown eye and one blue eye, she has a broad nose NECK:  No jugular venous distention, waveform within normal limits, carotid upstroke brisk and symmetric, no bruits, no thyromegaly LUNGS:  Clear to auscultation bilaterally CHEST:  Unremarkable HEART:  PMI not displaced or sustained,S1 and S2 within normal limits, no S3, no S4, no clicks, no rubs, no murmurs ABD:  Flat, positive bowel sounds normal in frequency in pitch, no bruits, no rebound, no guarding, no midline pulsatile mass, no hepatomegaly, no splenomegaly EXT:  2 plus pulses throughout, no edema, no cyanosis no clubbing   ASSESSMENT AND PLAN  Palpitations - due to paroxysmal atrial tachycardia. Patient has tolerated Metoprolol 12.5mg  BID since last visit well and has had remarkable improvement in palpitations on this regimen. Will continue current regimen. No signs of IUGR or fetal bradycardia at this time per patient report and she is to continue to follow with OB/gyn. Endocrine workup for low TSH only showed subclinical hyperthyroidism which was normal for first trimester pregnancy and she has planned follow up for this. Patient will return in July for follow up.   Aldine Contes. Marti Sleigh, MD, PGY2 10/31/2012 3:22 PM   History and all data above reviewed.  Patient examined.  I agree with the findings as above.  She feels better with the low dose beta blocker.    The patient exam reveals COR:RRR  ,  Lungs: Maintain hydration by drinking small amounts of clear fluids frequently, then soft diet, and then advance diet as tolerated. May use OTC Imodium if desired for any diarrhea.  Call if symptoms worsen, high fever, severe weakness or fainting, increased abdominal pain, blood in  stool or vomit, or failure to improve in 2-3 days.  ,  Abd: Positive bowel sounds, no rebound no guarding, Ext No edema  .  All available labs, radiology testing, previous records reviewed. Agree with documented assessment and plan. She can continue with the meds as listed.  She will let me know if she has any increasing palpitations.  Fayrene Fearing Elveria Lauderbaugh  3:25 PM  10/31/2012

## 2013-02-07 ENCOUNTER — Encounter: Payer: Self-pay | Admitting: Cardiology

## 2013-02-07 ENCOUNTER — Ambulatory Visit (INDEPENDENT_AMBULATORY_CARE_PROVIDER_SITE_OTHER): Payer: Medicare Other | Admitting: Cardiology

## 2013-02-07 VITALS — BP 105/80 | HR 83 | Ht 66.0 in | Wt 164.1 lb

## 2013-02-07 DIAGNOSIS — R002 Palpitations: Secondary | ICD-10-CM

## 2013-02-07 NOTE — Patient Instructions (Addendum)
Follow up as needed

## 2013-02-07 NOTE — Progress Notes (Signed)
   HPI The patient presents for evaluation of palpitations. She is now [redacted] weeks pregnant . Since I last saw her she has done well.  She does occasionally get palpitations particularly if she goes out in the hot weather. However, she denies any resting palpitations, presyncope or syncope. She's not had any chest pressure, neck or arm discomfort. She's had normal weight gain for pregnancy.  She does say that the beta blocker which he takes makes her feel somewhat fatigued.  No Known Allergies  Current Outpatient Prescriptions  Medication Sig Dispense Refill  . Doxylamine-Pyridoxine (DICLEGIS) 10-10 MG TBEC Take as needed for nausea      . metoprolol tartrate (LOPRESSOR) 25 MG tablet TAKE 1/2 TABLET TWICE DAILY  60 tablet  11  . PRENATAL VITAMINS PO Take by mouth daily.       No current facility-administered medications for this visit.    Past Medical History  Diagnosis Date  . History of chicken pox   . Heart murmur   . Heart palpitations   . Anemia     history of  . Depression   . Waardenburg syndrome    ROS:  Feeling tired but hard to say if that is due to pregnancy or medicine. Was dizzy for first 2 days on metoprolol but that has resolved completely. Occasional lightheadedness if stands very rapidly. Otherwise as stated in the HPI and negative for all other systems.  PHYSICAL EXAM BP 105/80  Pulse 83  Ht 5\' 6"  (1.676 m)  Wt 164 lb 1.9 oz (74.444 kg)  BMI 26.5 kg/m2  LMP 06/06/2012 GENERAL:  Well appearing HEENT:  Pupils equal round and reactive, fundi not visualized, oral mucosa unremarkable, she has one brown eye and one blue eye, she has a broad nose NECK:  No jugular venous distention, waveform within normal limits, carotid upstroke brisk and symmetric, no bruits, no thyromegaly LUNGS:  Clear to auscultation bilaterally CHEST:  Unremarkable HEART:  PMI not displaced or sustained,S1 and S2 within normal limits, no S3, no S4, no clicks, no rubs, no murmurs ABD:  Flat,  positive bowel sounds normal in frequency in pitch, no bruits, no rebound, no guarding, no midline pulsatile mass, no hepatomegaly, no splenomegaly EXT:  2 plus pulses throughout, no edema, no cyanosis no clubbing  EKG:  Sinus rhythm, rate 83, axis within normal limits, intervals within normal limits, no acute ST-T wave changes.02/07/2013  ASSESSMENT AND PLAN  Palpitations - Paroxysmal atrial tachycardia. No change in therapy is indicated. We discussed stopping the beta blocker after delivery and writing he now if she has continued palpitations. I would be happy to see her back to reassess this.

## 2013-02-25 ENCOUNTER — Encounter (HOSPITAL_COMMUNITY): Payer: Self-pay | Admitting: *Deleted

## 2013-02-25 ENCOUNTER — Inpatient Hospital Stay (HOSPITAL_COMMUNITY): Payer: Medicare Other

## 2013-02-25 ENCOUNTER — Inpatient Hospital Stay (HOSPITAL_COMMUNITY)
Admission: AD | Admit: 2013-02-25 | Discharge: 2013-02-25 | Disposition: A | Discharge status: Home / Self Care | Payer: Medicare Other | Source: Ambulatory Visit | Attending: Obstetrics and Gynecology | Admitting: Obstetrics and Gynecology

## 2013-02-25 DIAGNOSIS — O99891 Other specified diseases and conditions complicating pregnancy: Secondary | ICD-10-CM | POA: Insufficient documentation

## 2013-02-25 DIAGNOSIS — O479 False labor, unspecified: Secondary | ICD-10-CM | POA: Insufficient documentation

## 2013-02-25 NOTE — MAU Note (Signed)
I leaked small amt fld 1900 and 1930. Feel tightness every but no pain.

## 2013-02-25 NOTE — MAU Note (Signed)
PT IS DEAF-   JENNIE IN ROOM TO SIGN FOR PT   .PT SAYS AT 7PM TONIGHT  SHE WALKING OUTSIDE  WITH SON  AND FELT A GUSH OF FLUID.  WENT HOME  AND FELT ANOTHER GUSH IN TOILET.   THEN AT 730- ANOTHER GUSH.    SHE CALLED DR - -  TOLD TO COME IN.        NOW FEELS SOME MILD UC'S.   NO VE IN OFFICE.  HAS POS GBS.   DENIES HSV .   HAD MRSA-  IN 2009  HAD SORE ON LEFT TOP   THIGH.

## 2013-03-10 ENCOUNTER — Inpatient Hospital Stay (HOSPITAL_COMMUNITY)
Admission: AD | Admit: 2013-03-10 | Discharge: 2013-03-12 | DRG: 775 | Disposition: A | Discharge status: Home / Self Care | Payer: Medicare Other | Source: Ambulatory Visit | Attending: Obstetrics and Gynecology | Admitting: Obstetrics and Gynecology

## 2013-03-10 ENCOUNTER — Inpatient Hospital Stay (HOSPITAL_COMMUNITY): Payer: Medicare Other | Admitting: Anesthesiology

## 2013-03-10 ENCOUNTER — Encounter (HOSPITAL_COMMUNITY): Payer: Self-pay | Admitting: Anesthesiology

## 2013-03-10 ENCOUNTER — Encounter (HOSPITAL_COMMUNITY): Payer: Self-pay | Admitting: *Deleted

## 2013-03-10 DIAGNOSIS — Z3490 Encounter for supervision of normal pregnancy, unspecified, unspecified trimester: Secondary | ICD-10-CM

## 2013-03-10 DIAGNOSIS — H9190 Unspecified hearing loss, unspecified ear: Secondary | ICD-10-CM | POA: Diagnosis present

## 2013-03-10 DIAGNOSIS — O99892 Other specified diseases and conditions complicating childbirth: Principal | ICD-10-CM | POA: Diagnosis present

## 2013-03-10 LAB — CBC
MCH: 29.1 pg (ref 26.0–34.0)
MCV: 86.6 fL (ref 78.0–100.0)
Platelets: 253 10*3/uL (ref 150–400)
RBC: 3.82 MIL/uL — ABNORMAL LOW (ref 3.87–5.11)
RDW: 13.4 % (ref 11.5–15.5)

## 2013-03-10 MED ORDER — ONDANSETRON HCL 4 MG/2ML IJ SOLN: 4.0000 mg | Freq: Four times a day (QID) | INTRAMUSCULAR | Status: DC | PRN

## 2013-03-10 MED ORDER — ONDANSETRON HCL 4 MG/2ML IJ SOLN: 4.0000 mg | INTRAMUSCULAR | Status: DC | PRN

## 2013-03-10 MED ORDER — PENICILLIN G POTASSIUM 5000000 UNITS IJ SOLR
5.0000 10*6.[IU] | Freq: Once | INTRAVENOUS | Status: AC
  Administered 2013-03-10: 5 10*6.[IU] via INTRAVENOUS
  Filled 2013-03-10: qty 5

## 2013-03-10 MED ORDER — OXYTOCIN BOLUS FROM INFUSION: 500.0000 mL | INTRAVENOUS | Status: DC

## 2013-03-10 MED ORDER — OXYTOCIN 40 UNITS IN LACTATED RINGERS INFUSION - SIMPLE MED: 62.5000 mL/h | INTRAVENOUS | Status: DC

## 2013-03-10 MED ORDER — SENNOSIDES-DOCUSATE SODIUM 8.6-50 MG PO TABS
2.0000 | ORAL_TABLET | Freq: Every day | ORAL | Status: DC
  Administered 2013-03-10 – 2013-03-11 (×2): 2 via ORAL

## 2013-03-10 MED ORDER — TETANUS-DIPHTH-ACELL PERTUSSIS 5-2.5-18.5 LF-MCG/0.5 IM SUSP
0.5000 mL | Freq: Once | INTRAMUSCULAR | Status: AC
  Administered 2013-03-11: 0.5 mL via INTRAMUSCULAR
  Filled 2013-03-10: qty 0.5

## 2013-03-10 MED ORDER — DIPHENHYDRAMINE HCL 50 MG/ML IJ SOLN: 12.5000 mg | INTRAMUSCULAR | Status: DC | PRN

## 2013-03-10 MED ORDER — METOPROLOL TARTRATE 25 MG PO TABS
25.0000 mg | ORAL_TABLET | Freq: Two times a day (BID) | ORAL | Status: DC
  Administered 2013-03-10: 25 mg via ORAL
  Filled 2013-03-10 (×2): qty 1

## 2013-03-10 MED ORDER — OXYTOCIN 40 UNITS IN LACTATED RINGERS INFUSION - SIMPLE MED
1.0000 m[IU]/min | INTRAVENOUS | Status: DC
  Administered 2013-03-10: 2 m[IU]/min via INTRAVENOUS
  Filled 2013-03-10: qty 1000

## 2013-03-10 MED ORDER — LACTATED RINGERS IV SOLN
INTRAVENOUS | Status: DC
  Administered 2013-03-10 (×2): via INTRAVENOUS

## 2013-03-10 MED ORDER — BENZOCAINE-MENTHOL 20-0.5 % EX AERO
1.0000 "application " | INHALATION_SPRAY | CUTANEOUS | Status: DC | PRN
  Filled 2013-03-10: qty 56

## 2013-03-10 MED ORDER — PRENATAL MULTIVITAMIN CH
1.0000 | ORAL_TABLET | Freq: Every day | ORAL | Status: DC
  Administered 2013-03-11: 1 via ORAL
  Filled 2013-03-10: qty 1

## 2013-03-10 MED ORDER — LACTATED RINGERS IV SOLN: 500.0000 mL | INTRAVENOUS | Status: DC | PRN

## 2013-03-10 MED ORDER — OXYCODONE-ACETAMINOPHEN 5-325 MG PO TABS: 1.0000 | ORAL_TABLET | ORAL | Status: DC | PRN

## 2013-03-10 MED ORDER — IBUPROFEN 600 MG PO TABS: 600.0000 mg | ORAL_TABLET | Freq: Four times a day (QID) | ORAL | Status: DC | PRN

## 2013-03-10 MED ORDER — FENTANYL 2.5 MCG/ML BUPIVACAINE 1/10 % EPIDURAL INFUSION (WH - ANES)
14.0000 mL/h | INTRAMUSCULAR | Status: DC | PRN
  Administered 2013-03-10: 14 mL/h via EPIDURAL
  Filled 2013-03-10: qty 125

## 2013-03-10 MED ORDER — MEASLES, MUMPS & RUBELLA VAC ~~LOC~~ INJ: 0.5000 mL | INJECTION | Freq: Once | SUBCUTANEOUS | Status: DC

## 2013-03-10 MED ORDER — CITRIC ACID-SODIUM CITRATE 334-500 MG/5ML PO SOLN: 30.0000 mL | ORAL | Status: DC | PRN

## 2013-03-10 MED ORDER — LIDOCAINE HCL (PF) 1 % IJ SOLN
30.0000 mL | INTRAMUSCULAR | Status: DC | PRN
  Filled 2013-03-10 (×2): qty 30

## 2013-03-10 MED ORDER — ACETAMINOPHEN 325 MG PO TABS: 650.0000 mg | ORAL_TABLET | ORAL | Status: DC | PRN

## 2013-03-10 MED ORDER — TERBUTALINE SULFATE 1 MG/ML IJ SOLN: 0.2500 mg | Freq: Once | INTRAMUSCULAR | Status: DC | PRN

## 2013-03-10 MED ORDER — PHENYLEPHRINE 40 MCG/ML (10ML) SYRINGE FOR IV PUSH (FOR BLOOD PRESSURE SUPPORT)
80.0000 ug | PREFILLED_SYRINGE | INTRAVENOUS | Status: DC | PRN
  Filled 2013-03-10: qty 2
  Filled 2013-03-10: qty 5

## 2013-03-10 MED ORDER — ZOLPIDEM TARTRATE 5 MG PO TABS: 5.0000 mg | ORAL_TABLET | Freq: Every evening | ORAL | Status: DC | PRN

## 2013-03-10 MED ORDER — PHENYLEPHRINE 40 MCG/ML (10ML) SYRINGE FOR IV PUSH (FOR BLOOD PRESSURE SUPPORT)
80.0000 ug | PREFILLED_SYRINGE | INTRAVENOUS | Status: DC | PRN
  Filled 2013-03-10: qty 2

## 2013-03-10 MED ORDER — FLEET ENEMA 7-19 GM/118ML RE ENEM: 1.0000 | ENEMA | RECTAL | Status: DC | PRN

## 2013-03-10 MED ORDER — DIBUCAINE 1 % RE OINT: 1.0000 "application " | TOPICAL_OINTMENT | RECTAL | Status: DC | PRN

## 2013-03-10 MED ORDER — WITCH HAZEL-GLYCERIN EX PADS: 1.0000 "application " | MEDICATED_PAD | CUTANEOUS | Status: DC | PRN

## 2013-03-10 MED ORDER — LIDOCAINE HCL (PF) 1 % IJ SOLN
INTRAMUSCULAR | Status: DC | PRN
  Administered 2013-03-10 (×4): 4 mL

## 2013-03-10 MED ORDER — EPHEDRINE 5 MG/ML INJ
10.0000 mg | INTRAVENOUS | Status: DC | PRN
  Filled 2013-03-10: qty 2
  Filled 2013-03-10: qty 4

## 2013-03-10 MED ORDER — ONDANSETRON HCL 4 MG PO TABS: 4.0000 mg | ORAL_TABLET | ORAL | Status: DC | PRN

## 2013-03-10 MED ORDER — SIMETHICONE 80 MG PO CHEW: 80.0000 mg | CHEWABLE_TABLET | ORAL | Status: DC | PRN

## 2013-03-10 MED ORDER — PENICILLIN G POTASSIUM 5000000 UNITS IJ SOLR
2.5000 10*6.[IU] | INTRAVENOUS | Status: DC
  Filled 2013-03-10 (×4): qty 2.5

## 2013-03-10 MED ORDER — IBUPROFEN 600 MG PO TABS
600.0000 mg | ORAL_TABLET | Freq: Four times a day (QID) | ORAL | Status: DC
  Administered 2013-03-10 – 2013-03-12 (×6): 600 mg via ORAL
  Filled 2013-03-10 (×7): qty 1

## 2013-03-10 MED ORDER — LACTATED RINGERS IV SOLN: 500.0000 mL | Freq: Once | INTRAVENOUS | Status: DC

## 2013-03-10 MED ORDER — EPHEDRINE 5 MG/ML INJ
10.0000 mg | INTRAVENOUS | Status: DC | PRN
  Filled 2013-03-10: qty 2

## 2013-03-10 NOTE — Anesthesia Procedure Notes (Signed)
Epidural Patient location during procedure: OB Start time: 03/10/2013 10:09 AM  Staffing Performed by: anesthesiologist   Preanesthetic Checklist Completed: patient identified, site marked, surgical consent, pre-op evaluation, timeout performed, IV checked, risks and benefits discussed and monitors and equipment checked  Epidural Patient position: sitting Prep: site prepped and draped and DuraPrep Patient monitoring: continuous pulse ox and blood pressure Approach: midline Injection technique: LOR air  Needle:  Needle type: Tuohy  Needle gauge: 17 G Needle length: 9 cm and 9 Needle insertion depth: 4 cm Catheter type: closed end flexible Catheter size: 19 Gauge Catheter at skin depth: 9 cm Test dose: negative  Assessment Events: blood not aspirated, injection not painful, no injection resistance, negative IV test and no paresthesia  Additional Notes Discussed risk of headache, infection, bleeding, nerve injury and failed or incomplete block.  Patient voices understanding and wishes to proceed.  Sign language interpreter present throughout.  Epidural placed easily on first attempt.  No paresthesia.  Patient tolerated procedure well with no apparent complications.  Jasmine December, MDReason for block:procedure for pain

## 2013-03-10 NOTE — Anesthesia Preprocedure Evaluation (Addendum)
Anesthesia Evaluation  Patient identified by MRN, date of birth, ID band Patient awake    Reviewed: Allergy & Precautions, H&P , NPO status , Patient's Chart, lab work & pertinent test results, reviewed documented beta blocker date and time   History of Anesthesia Complications Negative for: history of anesthetic complications  Airway Mallampati: II TM Distance: >3 FB Neck ROM: full    Dental  (+) Teeth Intact   Pulmonary neg pulmonary ROS,  breath sounds clear to auscultation        Cardiovascular + Valvular Problems/Murmurs (murmur, palpitations (controlled on metoprolol)) Rhythm:regular Rate:Normal  NSR EKG from 1/14 and 7/14 on chart   Neuro/Psych PSYCHIATRIC DISORDERS (depression) Waardenburg syndrome - depigmentation and deafness)    GI/Hepatic negative GI ROS, Neg liver ROS,   Endo/Other  negative endocrine ROS  Renal/GU negative Renal ROS  negative genitourinary   Musculoskeletal   Abdominal   Peds  Hematology negative hematology ROS (+)   Anesthesia Other Findings   Reproductive/Obstetrics (+) Pregnancy                          Anesthesia Physical Anesthesia Plan  ASA: II  Anesthesia Plan: Epidural   Post-op Pain Management:    Induction:   Airway Management Planned:   Additional Equipment:   Intra-op Plan:   Post-operative Plan:   Informed Consent: I have reviewed the patients History and Physical, chart, labs and discussed the procedure including the risks, benefits and alternatives for the proposed anesthesia with the patient or authorized representative who has indicated his/her understanding and acceptance.     Plan Discussed with:   Anesthesia Plan Comments:         Anesthesia Quick Evaluation

## 2013-03-10 NOTE — MAU Note (Signed)
Patient states she is having contractions every 6 minutes with bloody show. Reports good fetal movement, no leaking. Patient is deaf, does read lips and is waiting for a translator.

## 2013-03-10 NOTE — H&P (Signed)
Pt is a 32y/o white female, G3P2002 at term who presents to the ER c/o contractions. On admission pt was 5 cm. PNC was uncomplicated. 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 One of her children also has this dx.  PMHx: See hollister. PE: VSSAF        HEENT- wnl, pupils different colors        Abd- gravid, non tender IMP/ IUP at term in labor Plan/ Admit.

## 2013-03-10 NOTE — MAU Note (Signed)
Contractions started at 0500

## 2013-03-11 LAB — CBC
HCT: 28.8 % — ABNORMAL LOW (ref 36.0–46.0)
Hemoglobin: 9.7 g/dL — ABNORMAL LOW (ref 12.0–15.0)
MCH: 29.6 pg (ref 26.0–34.0)
MCV: 87.8 fL (ref 78.0–100.0)
Platelets: 220 10*3/uL (ref 150–400)
RBC: 3.28 MIL/uL — ABNORMAL LOW (ref 3.87–5.11)
WBC: 9.5 10*3/uL (ref 4.0–10.5)

## 2013-03-11 MED ORDER — METOPROLOL TARTRATE 12.5 MG HALF TABLET
12.5000 mg | ORAL_TABLET | Freq: Two times a day (BID) | ORAL | Status: DC
  Administered 2013-03-11 – 2013-03-12 (×3): 12.5 mg via ORAL
  Filled 2013-03-11 (×3): qty 1

## 2013-03-11 NOTE — Anesthesia Postprocedure Evaluation (Signed)
Anesthesia Post Note  Patient: Taylor Ferguson  Procedure(s) Performed: * No procedures listed *  Anesthesia type: Epidural  Patient location: Mother/Baby  Post pain: Pain level controlled  Post assessment: Post-op Vital signs reviewed  Last Vitals: BP 116/80  Pulse 69  Temp(Src) 36.4 C (Oral)  Resp 20  Ht 5\' 6"  (1.676 m)  Wt 169 lb 12.8 oz (77.021 kg)  BMI 27.42 kg/m2  SpO2 98%  LMP 06/06/2012  Post vital signs: Reviewed  Level of consciousness: awake  Complications: No apparent anesthesia complications

## 2013-03-11 NOTE — Progress Notes (Signed)
Patient was referred for history of depression/anxiety.  * Referral screened out by Clinical Social Worker because none of the following criteria appear to apply:  ~ History of anxiety/depression during this pregnancy, or of post-partum depression.  ~ Diagnosis of anxiety and/or depression within last 3 years  ~ History of depression due to pregnancy loss/loss of child  OR  * Patient's symptoms currently being treated with medication and/or therapy.  Please contact the Clinical Social Worker if needs arise, or by the patient's request.  Pt denies depression history. Depression hx is not noted in pt's chart.

## 2013-03-11 NOTE — Lactation Note (Addendum)
This note was copied from the chart of Taylor Ferguson. Lactation Consultation Note: Follow up visit with mom with [name] a deaf interpreter. Mom reports that baby is nursing well with no pian- getting better. Experienced BF mom who nursed last baby for 6 months. No questions at present. To call prn  Patient Name: Taylor Ferguson [id number] Date: 03/11/2013 Reason for consult: Follow-up assessment   Maternal Data    Feeding   LATCH Score/Interventions                      Lactation Tools Discussed/Used     Consult Status Consult Status: Complete    Pamelia Hoit 03/11/2013, 8:07 AM

## 2013-03-11 NOTE — Progress Notes (Signed)
PPD#1 Pt without complaints.Lochia -mild VSSAF IMP/ stable PLAN/ Will discharge in am.

## 2013-03-12 NOTE — Discharge Summary (Signed)
Obstetric Discharge Summary Reason for Admission: onset of labor Prenatal Procedures: ultrasound Intrapartum Procedures: spontaneous vaginal delivery Postpartum Procedures: none Complications-Operative and Postpartum: none Hemoglobin  Date Value Range Status  03/11/2013 9.7* 12.0 - 15.0 g/dL Final     HCT  Date Value Range Status  03/11/2013 28.8* 36.0 - 46.0 % Final    Physical Exam:  General: alert Lochia: appropriate Uterine Fundus: firm  Discharge Diagnoses: Term Pregnancy-delivered  Discharge Information: Date: 03/12/2013 Activity: pelvic rest Diet: routine Medications: PNV Condition: stable Instructions: refer to practice specific booklet Discharge to: home Follow-up Information   Follow up with Levi Aland, MD.   Specialty:  Obstetrics and Gynecology   Contact information:   [address] [place] [address] [phone]       Newborn Data: Live born female  Birth Weight: 8 lb 4.4 oz (3754 g) APGAR: 8, 9  Home with mother.  ANDERSON,MARK E 03/12/2013, 9:21 AM

## 2013-03-12 NOTE — Progress Notes (Signed)
PPD#2 Pt and baby are doing well. Will discharge to home.

## 2013-03-13 ENCOUNTER — Inpatient Hospital Stay (HOSPITAL_COMMUNITY): Admission: RE | Admit: 2013-03-13 | Payer: Medicare Other | Source: Ambulatory Visit

## 2013-03-13 NOTE — Progress Notes (Signed)
Post discharge chart review completed.  

## 2013-06-01 ENCOUNTER — Other Ambulatory Visit: Payer: Self-pay

## 2013-10-02 ENCOUNTER — Encounter: Payer: Self-pay | Admitting: Family

## 2013-10-02 ENCOUNTER — Ambulatory Visit (INDEPENDENT_AMBULATORY_CARE_PROVIDER_SITE_OTHER): Payer: Medicare HMO | Admitting: Family

## 2013-10-02 VITALS — BP 110/76 | HR 93 | Temp 97.9°F | Resp 14 | Ht 66.0 in | Wt 143.1 lb

## 2013-10-02 DIAGNOSIS — R42 Dizziness and giddiness: Secondary | ICD-10-CM

## 2013-10-02 NOTE — Patient Instructions (Signed)
Start claritin 10mg  once daily to help with ear congestion/dizziness. Send me a mychart message if symptoms worsen, or if symptoms do not improve.

## 2013-10-02 NOTE — Assessment & Plan Note (Signed)
Suspect secondary to bilateral ear effusion. Will add claritin once daily. If no improvement in symptoms plan referral to ENT.  We are limited in Med Choices as pt is breastfeeding.

## 2013-10-02 NOTE — Progress Notes (Signed)
Pre visit review using our clinic review tool, if applicable. No additional management support is needed unless otherwise documented below in the visit note. 

## 2013-10-02 NOTE — Progress Notes (Signed)
Subjective:    Patient ID: Taylor Ferguson, female    DOB: July 09, 1981, 33 y.o.   MRN: [id number]  HPI  Taylor Ferguson is a 33 yr old hearing impaired female who presents today with Sign language interpreter. She reports chief complaint of dizziness which started 3 months ago.  Wasn't every day at that time. Notes that since Friday symptoms have become constant. Reports symptoms can occur with rest and with movement.  Has some HA pain- feels like "lightening" in my head- sharp shooting pains.   She denies nasal congestion, sinus pain, ear pain.  Sh has not used any meds over the counter because she is breast feeding her 30 month old infant.      Review of Systems See HPI  Past Medical History  Diagnosis Date  . History of chicken pox   . Heart murmur   . Heart palpitations   . Anemia     history of  . Depression   . Waardenburg syndrome     History   Social History  . Marital Status: Divorced    Spouse Name: N/A    Number of Children: 2  . Years of Education: N/A   Occupational History  . homemaker    Social History Main Topics  . Smoking status: Never Smoker   . Smokeless tobacco: Never Used  . Alcohol Use: 1.5 oz/week    3 drink(s) per week     Comment: NONE WHILE PREG  . Drug Use: No  . Sexual Activity: Not on file   Other Topics Concern  . Not on file   Social History Narrative   Regular exercise:  Yes   Divorced--engaged   [name]    Past Surgical History  Procedure Laterality Date  . Eye surgery      bilateral -- Waardenburg Syndrome  . Wisdom tooth extraction      Family History  Problem Relation Age of Onset  . Hearing loss Son     deaf  . Stroke Maternal Grandmother   . CAD Maternal Grandfather 52  . CAD Paternal Grandfather 42    No Known Allergies  Current Outpatient Prescriptions on File Prior to Visit  Medication Sig Dispense Refill  . metoprolol tartrate (LOPRESSOR) 25 MG tablet TAKE 1/2 TABLET TWICE DAILY  60 tablet   11  . Prenatal Vit-Fe Fumarate-FA (PRENATAL MULTIVITAMIN) TABS tablet Take 1 tablet by mouth daily at 12 noon.       No current facility-administered medications on file prior to visit.    BP 110/76  Pulse 93  Temp(Src) 97.9 F (36.6 C) (Oral)  Resp 14  Ht 5\' 6"  (1.676 m)  Wt 143 lb 1.3 oz (64.901 kg)  BMI 23.10 kg/m2  SpO2 99%  LMP 09/27/2013       Objective:   Physical Exam  Constitutional: She is oriented to person, place, and time. She appears well-developed and well-nourished. No distress.  HENT:  Head: Normocephalic and atraumatic.  Right Ear: Ear canal normal.  Left Ear: Ear canal normal.  + clear serous effusion bilaterally without erythema  Cardiovascular: Normal rate and regular rhythm.   No murmur heard. Pulmonary/Chest: Effort normal and breath sounds normal. No respiratory distress. She has no wheezes. She has no rales. She exhibits no tenderness.  Neurological: She is alert and oriented to person, place, and time.  Psychiatric: She has a normal mood and affect. Her behavior is normal. Judgment and thought content normal.  Assessment & Plan:

## 2013-10-03 ENCOUNTER — Ambulatory Visit: Payer: Medicare Other | Admitting: Family

## 2014-03-14 DIAGNOSIS — H9190 Unspecified hearing loss, unspecified ear: Secondary | ICD-10-CM | POA: Insufficient documentation

## 2014-03-14 DIAGNOSIS — Q8789 Other specified congenital malformation syndromes, not elsewhere classified: Secondary | ICD-10-CM | POA: Insufficient documentation

## 2014-05-11 ENCOUNTER — Other Ambulatory Visit: Payer: Self-pay

## 2014-05-28 ENCOUNTER — Encounter: Payer: Self-pay | Admitting: Family

## 2014-07-27 LAB — HM PAP SMEAR: HM PAP: NORMAL

## 2014-07-27 NOTE — L&D Delivery Note (Signed)
Delivery Note At 1:31 AM a viable and healthy female was delivered via Vaginal, Spontaneous Delivery (Presentation: OA;  ).  APGAR: 8, 9; weight  .-pending Placenta status: Intact, Spontaneous.  Cord:  with the following complications: None.  Cord pH: N/A  Anesthesia: Epidural  Episiotomy: None Lacerations: None Est. Blood Loss (mL): 250  Mom to postpartum.  Baby to Couplet care / Skin to Skin.  MODY,VAISHALI R 04/14/2015, 1:52 AM  NPO after 4 am for PPTL at 12 noon.

## 2014-08-21 DIAGNOSIS — Z3201 Encounter for pregnancy test, result positive: Secondary | ICD-10-CM | POA: Diagnosis not present

## 2014-09-06 DIAGNOSIS — Z3201 Encounter for pregnancy test, result positive: Secondary | ICD-10-CM | POA: Diagnosis not present

## 2014-09-20 DIAGNOSIS — Z3481 Encounter for supervision of other normal pregnancy, first trimester: Secondary | ICD-10-CM | POA: Diagnosis not present

## 2014-09-20 DIAGNOSIS — Z36 Encounter for antenatal screening of mother: Secondary | ICD-10-CM | POA: Diagnosis not present

## 2014-09-20 LAB — OB RESULTS CONSOLE RPR: RPR: NONREACTIVE

## 2014-09-20 LAB — OB RESULTS CONSOLE ABO/RH: RH TYPE: NEGATIVE

## 2014-09-20 LAB — OB RESULTS CONSOLE HIV ANTIBODY (ROUTINE TESTING): HIV: NONREACTIVE

## 2014-09-20 LAB — OB RESULTS CONSOLE ANTIBODY SCREEN: ANTIBODY SCREEN: NEGATIVE

## 2014-09-20 LAB — OB RESULTS CONSOLE RUBELLA ANTIBODY, IGM: Rubella: NON-IMMUNE/NOT IMMUNE

## 2014-09-20 LAB — OB RESULTS CONSOLE HEPATITIS B SURFACE ANTIGEN: Hepatitis B Surface Ag: NEGATIVE

## 2014-10-05 DIAGNOSIS — Z36 Encounter for antenatal screening of mother: Secondary | ICD-10-CM | POA: Diagnosis not present

## 2014-10-05 DIAGNOSIS — Z3481 Encounter for supervision of other normal pregnancy, first trimester: Secondary | ICD-10-CM | POA: Diagnosis not present

## 2014-10-05 LAB — OB RESULTS CONSOLE GC/CHLAMYDIA
Chlamydia: NEGATIVE
Gonorrhea: NEGATIVE

## 2014-11-06 DIAGNOSIS — K111 Hypertrophy of salivary gland: Secondary | ICD-10-CM | POA: Diagnosis not present

## 2014-11-06 DIAGNOSIS — Z331 Pregnant state, incidental: Secondary | ICD-10-CM | POA: Diagnosis not present

## 2014-11-15 DIAGNOSIS — Z36 Encounter for antenatal screening of mother: Secondary | ICD-10-CM | POA: Diagnosis not present

## 2014-11-26 DIAGNOSIS — Z36 Encounter for antenatal screening of mother: Secondary | ICD-10-CM | POA: Diagnosis not present

## 2014-11-26 DIAGNOSIS — O352XX0 Maternal care for (suspected) hereditary disease in fetus, not applicable or unspecified: Secondary | ICD-10-CM | POA: Diagnosis not present

## 2014-11-26 DIAGNOSIS — Z3482 Encounter for supervision of other normal pregnancy, second trimester: Secondary | ICD-10-CM | POA: Diagnosis not present

## 2014-12-11 ENCOUNTER — Encounter (HOSPITAL_COMMUNITY): Payer: Self-pay | Admitting: *Deleted

## 2014-12-11 ENCOUNTER — Inpatient Hospital Stay (HOSPITAL_COMMUNITY): Payer: Commercial Managed Care - HMO

## 2014-12-11 ENCOUNTER — Observation Stay (HOSPITAL_COMMUNITY)
Admission: AD | Admit: 2014-12-11 | Discharge: 2014-12-12 | Disposition: A | Discharge status: Home / Self Care | Payer: Commercial Managed Care - HMO | Source: Ambulatory Visit | Attending: Obstetrics and Gynecology | Admitting: Obstetrics and Gynecology

## 2014-12-11 DIAGNOSIS — R011 Cardiac murmur, unspecified: Secondary | ICD-10-CM | POA: Diagnosis not present

## 2014-12-11 DIAGNOSIS — E860 Dehydration: Principal | ICD-10-CM | POA: Diagnosis present

## 2014-12-11 DIAGNOSIS — R1013 Epigastric pain: Secondary | ICD-10-CM | POA: Diagnosis not present

## 2014-12-11 DIAGNOSIS — Q798 Other congenital malformations of musculoskeletal system: Secondary | ICD-10-CM | POA: Insufficient documentation

## 2014-12-11 DIAGNOSIS — K219 Gastro-esophageal reflux disease without esophagitis: Secondary | ICD-10-CM | POA: Insufficient documentation

## 2014-12-11 DIAGNOSIS — R12 Heartburn: Secondary | ICD-10-CM | POA: Diagnosis present

## 2014-12-11 DIAGNOSIS — O26892 Other specified pregnancy related conditions, second trimester: Secondary | ICD-10-CM | POA: Diagnosis not present

## 2014-12-11 DIAGNOSIS — D649 Anemia, unspecified: Secondary | ICD-10-CM | POA: Insufficient documentation

## 2014-12-11 DIAGNOSIS — Z79899 Other long term (current) drug therapy: Secondary | ICD-10-CM | POA: Diagnosis not present

## 2014-12-11 DIAGNOSIS — R1110 Vomiting, unspecified: Secondary | ICD-10-CM | POA: Insufficient documentation

## 2014-12-11 DIAGNOSIS — F329 Major depressive disorder, single episode, unspecified: Secondary | ICD-10-CM | POA: Insufficient documentation

## 2014-12-11 DIAGNOSIS — M549 Dorsalgia, unspecified: Secondary | ICD-10-CM | POA: Insufficient documentation

## 2014-12-11 DIAGNOSIS — D1809 Hemangioma of other sites: Secondary | ICD-10-CM | POA: Diagnosis not present

## 2014-12-11 LAB — COMPREHENSIVE METABOLIC PANEL
ALT: 12 U/L — ABNORMAL LOW (ref 14–54)
AST: 12 U/L — ABNORMAL LOW (ref 15–41)
Albumin: 3.3 g/dL — ABNORMAL LOW (ref 3.5–5.0)
Alkaline Phosphatase: 47 U/L (ref 38–126)
Anion gap: 11 (ref 5–15)
BUN: 12 mg/dL (ref 6–20)
CO2: 20 mmol/L — ABNORMAL LOW (ref 22–32)
Calcium: 8.5 mg/dL — ABNORMAL LOW (ref 8.9–10.3)
Chloride: 104 mmol/L (ref 101–111)
Creatinine, Ser: 0.56 mg/dL (ref 0.44–1.00)
GFR calc Af Amer: >60 mL/min (ref >60)
GFR calc non Af Amer: >60 mL/min (ref >60)
Glucose, Bld: 72 mg/dL (ref 65–99)
Potassium: 3.5 mmol/L (ref 3.5–5.1)
Sodium: 135 mmol/L (ref 135–145)
Total Bilirubin: 0.8 mg/dL (ref 0.3–1.2)
Total Protein: 6.7 g/dL (ref 6.5–8.1)

## 2014-12-11 LAB — CBC
HCT: 33 % — ABNORMAL LOW (ref 36.0–46.0)
Hemoglobin: 11.5 g/dL — ABNORMAL LOW (ref 12.0–15.0)
MCH: 31 pg (ref 26.0–34.0)
MCHC: 34.8 g/dL (ref 30.0–36.0)
MCV: 88.9 fL (ref 78.0–100.0)
Platelets: 219 10*3/uL (ref 150–400)
RBC: 3.71 MIL/uL — ABNORMAL LOW (ref 3.87–5.11)
RDW: 13.5 % (ref 11.5–15.5)
WBC: 8 10*3/uL (ref 4.0–10.5)

## 2014-12-11 LAB — URINALYSIS, DIPSTICK ONLY
Bilirubin Urine: NEGATIVE
Glucose, UA: 500 mg/dL — AB
Hgb urine dipstick: NEGATIVE
Ketones, ur: >80 mg/dL — AB
Leukocytes, UA: NEGATIVE
Nitrite: NEGATIVE
Protein, ur: NEGATIVE mg/dL
Specific Gravity, Urine: 1.02 (ref 1.005–1.030)
Urobilinogen, UA: 0.2 mg/dL (ref 0.0–1.0)
pH: 6 (ref 5.0–8.0)

## 2014-12-11 LAB — URINALYSIS, ROUTINE W REFLEX MICROSCOPIC
BILIRUBIN URINE: NEGATIVE
Glucose, UA: NEGATIVE mg/dL
Hgb urine dipstick: NEGATIVE
Leukocytes, UA: NEGATIVE
NITRITE: NEGATIVE
Protein, ur: NEGATIVE mg/dL
Specific Gravity, Urine: >1.03 — ABNORMAL HIGH (ref 1.005–1.030)
UROBILINOGEN UA: 0.2 mg/dL (ref 0.0–1.0)
pH: 6 (ref 5.0–8.0)

## 2014-12-11 LAB — LIPASE, BLOOD: Lipase: 23 U/L (ref 22–51)

## 2014-12-11 LAB — AMYLASE: Amylase: 76 U/L (ref 28–100)

## 2014-12-11 MED ORDER — NALBUPHINE HCL 10 MG/ML IJ SOLN
5.0000 mg | Freq: Once | INTRAMUSCULAR | Status: AC
Start: 2014-12-11 — End: 2014-12-11
  Administered 2014-12-11: 5 mg via INTRAVENOUS
  Filled 2014-12-11: qty 1

## 2014-12-11 MED ORDER — LACTATED RINGERS IV SOLN
INTRAVENOUS | Status: DC
  Administered 2014-12-11 – 2014-12-12 (×3): via INTRAVENOUS

## 2014-12-11 MED ORDER — ONDANSETRON HCL 4 MG/2ML IJ SOLN
4.0000 mg | Freq: Once | INTRAMUSCULAR | Status: AC
  Administered 2014-12-11: 4 mg via INTRAVENOUS
  Filled 2014-12-11: qty 2

## 2014-12-11 MED ORDER — LACTATED RINGERS IV BOLUS (SEPSIS)
1000.0000 mL | Freq: Once | INTRAVENOUS | Status: AC
  Administered 2014-12-11: 1000 mL via INTRAVENOUS

## 2014-12-11 MED ORDER — M.V.I. ADULT IV INJ
INJECTION | Freq: Once | INTRAVENOUS | Status: AC
  Administered 2014-12-11: 21:00:00 via INTRAVENOUS
  Filled 2014-12-11: qty 10

## 2014-12-11 MED ORDER — FAMOTIDINE IN NACL 20-0.9 MG/50ML-% IV SOLN
20.0000 mg | Freq: Once | INTRAVENOUS | Status: AC
  Administered 2014-12-11: 20 mg via INTRAVENOUS
  Filled 2014-12-11: qty 50

## 2014-12-11 MED ORDER — GI COCKTAIL ~~LOC~~
30.0000 mL | Freq: Once | ORAL | Status: AC
  Administered 2014-12-11: 30 mL via ORAL
  Filled 2014-12-11: qty 30

## 2014-12-11 NOTE — Progress Notes (Signed)
Pt felt much better after GI cocktail, zofran, and pepcid, but is hurting again; desires something else for the pain.

## 2014-12-11 NOTE — MAU Note (Signed)
Since Sunday have burning pain mid chest that radiates to back. Can;t eat or drink . Feels like everything gets stuck and then i throw up. No diarrhea. Tried Zantac but did not help. Tried again this am but threw it up. Tums not helping. Tylenol not helping. Denies LOF or bleeding.

## 2014-12-11 NOTE — H&P (Signed)
OB ADMISSION/ HISTORY & PHYSICAL:  Admission Date: 12/11/2014  6:44 PM  Admit Diagnosis: 20 weeks / dehydration after 3 days of persistent vomiting / epigastric pain / GERD  Taylor Ferguson is a 34 y.o. female presenting for persistent vomiting since Sunday and unable to tolerate any PO intake. Reports severe burning in throat and reflux.  Prenatal History: G4P1003   EDC : 04/30/2015, by Other Basis  Prenatal care at [facility]  Primary Ob Provider: Dr Benjie Karvonen Prenatal course complicated by HX Waardenburg syndrome / deaf (requires translater) / PAT (paroxymal atrial tachycardia) - no meds this pregnancy / GERD  Prenatal Labs: ABO, Rh:  A negative Antibody:  negative Rubella:   Immune RPR:   NR HBsAg:  negative  HIV:   NR  Medical / Surgical History :  Past medical history:  Past Medical History  Diagnosis Date  . History of chicken pox   . Heart murmur   . Heart palpitations   . Anemia     history of  . Depression   . Waardenburg syndrome      Past surgical history:  Past Surgical History  Procedure Laterality Date  . Eye surgery      bilateral -- Waardenburg Syndrome  . Wisdom tooth extraction      Family History:  Family History  Problem Relation Age of Onset  . Hearing loss Son     deaf  . Stroke Maternal Grandmother   . CAD Maternal Grandfather 18  . CAD Paternal Grandfather 46     Social History:  reports that she has never smoked. She has never used smokeless tobacco. She reports that she drinks about 1.5 oz of alcohol per week. She reports that she does not use illicit drugs.   Allergies: Cinnamon    Current Medications at time of admission:  Prior to Admission medications   Medication Sig Start Date End Date Taking? Authorizing Provider  acetaminophen (TYLENOL) 500 MG tablet Take 1,000 mg by mouth every 6 (six) hours as needed for mild pain.   Yes Historical Provider, MD  calcium carbonate (TUMS - DOSED IN MG ELEMENTAL  CALCIUM) 500 MG chewable tablet Chew 2 tablets by mouth daily as needed for indigestion or heartburn.   Yes Historical Provider, MD  ondansetron (ZOFRAN-ODT) 4 MG disintegrating tablet Take 1 tablet by mouth every 8 (eight) hours as needed. 11/05/14  Yes Historical Provider, MD  Prenatal Vit-Fe Fumarate-FA (PRENATAL MULTIVITAMIN) TABS tablet Take 1 tablet by mouth daily at 12 noon.   Yes Historical Provider, MD  ranitidine (ZANTAC) 150 MG tablet Take 150 mg by mouth 2 (two) times daily as needed for heartburn.   Yes Historical Provider, MD    Review of Systems: Active FM Vomiting x 3 days Epigastric pain radiating to back Severe throat burning No bleeding or discharge No cramping Normal BM . No diarrhea  Physical Exam:  VS: Blood pressure 119/62, pulse 82, temperature 97.6 F (36.4 C), resp. rate 20, height 5\' 6"  (1.676 m), weight 71.033 kg (156 lb 9.6 oz), unknown if currently breastfeeding.  General: alert and oriented, appears ill with appearance of general malaise / pale Heart: RRR Lungs: Clear lung fields Abdomen: Gravid, soft, non-distended, mildly tender upper quadrant                  uterus: at umbilicus & non-tender / FHR 151 Extremities: no edema  Genitalia / VE:  deferrred   Assessment: [redacted] weeks gestation Dehydration secondary to  persistent vomiting x 3 days Epigastric pain - possible gallbladder etiology versus reflux GERD    Plan:  Admit for supportive care and IVF hydration Continue: Pepcid 20mg  IV Q 12 / Zofran 4mg  IV prn vomiting  Add : Protonix 40mg  Q day Clear liquids tonight as tolerated Repeat labs in am: CMP/amylase / lipase / urine dip - if labs stable & improving will advance diet as tolerate with low fat diet  DC home when re-hydrated and tolerating diet intake  Dr Ronita Hipps notified of admission / plan of care   Artelia Laroche CNM, MSN, [facility] 12/11/2014, 11:53 PM

## 2014-12-11 NOTE — MAU Provider Note (Signed)
History     CSN: [id number]  Arrival date and time: 12/11/14 1844 Provider here to see patient - report from nurse - orders placed @ 1935    Chief Complaint  Patient presents with  . Heartburn  . Back Pain   HPI  Reports unable to keep anything down since Sunday Nausea and vomiting persistent GERD with severe burning in throat Epigastric pain that radiates thru to back No constipation or diarrhea   Past Medical History  Diagnosis Date  . History of chicken pox   . Heart murmur   . Heart palpitations   . Anemia     history of  . Depression   . Waardenburg syndrome     Past Surgical History  Procedure Laterality Date  . Eye surgery      bilateral -- Waardenburg Syndrome  . Wisdom tooth extraction      Family History  Problem Relation Age of Onset  . Hearing loss Son     deaf  . Stroke Maternal Grandmother   . CAD Maternal Grandfather 20  . CAD Paternal Grandfather 34    History  Substance Use Topics  . Smoking status: Never Smoker   . Smokeless tobacco: Never Used  . Alcohol Use: 1.5 oz/week    3 drink(s) per week     Comment: NONE WHILE PREG    Allergies:  Allergies  Allergen Reactions  . Cinnamon Swelling    Red face    Prescriptions prior to admission  Medication Sig Dispense Refill Last Dose  . metoprolol tartrate (LOPRESSOR) 25 MG tablet TAKE 1/2 TABLET TWICE DAILY 60 tablet 11 Not Taking  . Prenatal Vit-Fe Fumarate-FA (PRENATAL MULTIVITAMIN) TABS tablet Take 1 tablet by mouth daily at 12 noon.   Not Taking    ROS  Nausea and vomiting Epigastric pain - radiates to back No diarrhea or constipation No bleeding or spotting + FM daily   Tried TUM and Zantac PRN without any relief  No rapid heart-rate or palpitations No chest pain or SOB  Physical Exam   Blood pressure 116/68, pulse 87, temperature 97.6 F (36.4 C), resp. rate 18, height 5\' 6"  (1.676 m), weight 71.033 kg (156 lb 9.6 oz), unknown if currently  breastfeeding.  Physical Exam  Alert and oriented / NAD / generalized malaise Abdomen soft / mildly tender upper abdomen FHR 151 with doppler Defer pelvic exam  U/A results for QUENESHA, DOUGLASS (MRN [id number]) as of 12/11/2014 20:11  Ref. Range 12/11/2014 19:10  Appearance Latest Ref Range: CLEAR  CLEAR  Bilirubin Urine Latest Ref Range: NEGATIVE  NEGATIVE  Color, Urine Latest Ref Range: YELLOW  YELLOW  Glucose Latest Ref Range: NEGATIVE mg/dL NEGATIVE  Hgb urine dipstick Latest Ref Range: NEGATIVE  NEGATIVE  Ketones, ur Latest Ref Range: NEGATIVE mg/dL >80 (A)  Leukocytes, UA Latest Ref Range: NEGATIVE  NEGATIVE  Nitrite Latest Ref Range: NEGATIVE  NEGATIVE  pH Latest Ref Range: 5.0-8.0  6.0  Protein Latest Ref Range: NEGATIVE mg/dL NEGATIVE  Specific Gravity, Urine Latest Ref Range: 1.005-1.030  >1.030 (H)  Urobilinogen, UA Latest Ref Range: 0.0-1.0 mg/dL 0.2   CBC results for KAEDYNCE, TAPP (MRN [id number]) as of 12/11/2014 20:11  Ref. Range 12/11/2014 19:52  WBC Latest Ref Range: 4.0-10.5 K/uL 8.0  Hemoglobin Latest Ref Range: 12.0-15.0 g/dL 11.5 (L)  HCT Latest Ref Range: 36.0-46.0 % 33.0 (L)  Platelets Latest Ref Range: 150-400 K/uL 219     CMP results for GENESEE, NASE (MRN [id number])  as of 12/11/2014 23:38  Ref. Range 12/11/2014 19:52  Sodium Latest Ref Range: 135-145 mmol/L 135  Potassium Latest Ref Range: 3.5-5.1 mmol/L 3.5  Chloride Latest Ref Range: 101-111 mmol/L 104  CO2 Latest Ref Range: 22-32 mmol/L 20 (L)  BUN Latest Ref Range: 6-20 mg/dL 12  Creatinine Latest Ref Range: 0.44-1.00 mg/dL 0.56  Calcium Latest Ref Range: 8.9-10.3 mg/dL 8.5 (L)  Glucose Latest Ref Range: 65-99 mg/dL 72  Anion gap Latest Ref Range: 5-15  11  Alkaline Phosphatase Latest Ref Range: 38-126 U/L 47  Albumin Latest Ref Range: 3.5-5.0 g/dL 3.3 (L)  Amylase Latest Ref Range: 28-100 U/L 76  Lipase Latest Ref Range: 22-51 U/L 23  AST Latest Ref Range: 15-41 U/L 12 (L)  ALT Latest  Ref Range: 14-54 U/L 12 (L)  Total Protein Latest Ref Range: 6.5-8.1 g/dL 6.7  Total Bilirubin Latest Ref Range: 0.3-1.2 mg/dL 0.8    MAU Course  Procedures Upper abdominal sono  CLINICAL DATA: Acute onset of epigastric abdominal pain, with elevated amylase and lipase. Initial encounter.  EXAM: US ABDOMEN LIMITED - RIGHT UPPER QUADRANT  COMPARISON: None.  FINDINGS: Gallbladder:  No gallstones or wall thickening visualized. The gallbladder is mildly distended but within normal limits. No sonographic Murphy sign noted.  Common bile duct:diameter: 0.3 cm, within normal limits in caliber.  Liver: A 1.7 cm hemangioma is noted within the left hepatic lobe, well circumscribed and demonstrating increased echogenicity. No additional masses are seen. Parenchymal echogenicity is within normal limits.  IMPRESSION: 1. No acute abnormality seen at the right upper quadrant. 2. 1.7 cm hepatic hemangioma noted.   Electronically Signed  By: Garald Balding M.D.  On: 12/12/2014 00:13  Assessment and Plan  [redacted] weeks pregnant Dehydration from persistent nausea and vomiting x 2 days Epigastric pain - possible GERD versus gall-bladder etiology  1) IVF hydration: LR with IV Zofran and IV Pepcid / D5LR with amp MVI / LR plain 2) assess labs for cholecystis - cholelithiasis - pancreatitis 3) GI cocktail after Zofran effective  Artelia Laroche 12/11/2014, 7:47 PM     Re-assessment @ 2345  GI cocktail and pepcid and nubain effective for pain and burning No vomiting since arrival to hospital  Elevated slightly amylase and lipase with normal LE  Repeat urine results after 2 liters IVF / 12/11/2014 23:48  Ref. Range 12/11/2014 23:15  Hgb urine dipstick Latest Ref Range: NEGATIVE  NEGATIVE  Ketones, ur Latest Ref Range: NEGATIVE mg/dL >80 (A)  Leukocytes, UA Latest Ref Range: NEGATIVE  NEGATIVE  Nitrite Latest Ref Range: NEGATIVE  NEGATIVE  pH Latest Ref Range: 5.0-8.0  6.0   Protein Latest Ref Range: NEGATIVE mg/dL NEGATIVE  Specific Gravity, Urine Latest Ref Range: 1.005-1.030  1.020  Urobilinogen, UA Latest Ref Range: 0.0-1.0 mg/dL 0.2   PLAN: Admit for supportive care and continued IVF hydration  Pepcid IV Q 12 hours Protonix 40Qday Zofran 4mg  PRN vomiting  Clear liquids tonight - repeat labs and urine in AM Advance diet as tolerated if CMP improved in am DC home with tolerating PO food intake - will maintain low fat diet

## 2014-12-12 ENCOUNTER — Encounter (HOSPITAL_COMMUNITY): Payer: Self-pay | Admitting: *Deleted

## 2014-12-12 DIAGNOSIS — R1013 Epigastric pain: Secondary | ICD-10-CM | POA: Diagnosis not present

## 2014-12-12 DIAGNOSIS — D649 Anemia, unspecified: Secondary | ICD-10-CM | POA: Diagnosis not present

## 2014-12-12 DIAGNOSIS — Q798 Other congenital malformations of musculoskeletal system: Secondary | ICD-10-CM | POA: Diagnosis not present

## 2014-12-12 DIAGNOSIS — E860 Dehydration: Secondary | ICD-10-CM | POA: Diagnosis not present

## 2014-12-12 DIAGNOSIS — R1110 Vomiting, unspecified: Secondary | ICD-10-CM | POA: Diagnosis not present

## 2014-12-12 DIAGNOSIS — F329 Major depressive disorder, single episode, unspecified: Secondary | ICD-10-CM | POA: Diagnosis not present

## 2014-12-12 DIAGNOSIS — R011 Cardiac murmur, unspecified: Secondary | ICD-10-CM | POA: Diagnosis not present

## 2014-12-12 DIAGNOSIS — M549 Dorsalgia, unspecified: Secondary | ICD-10-CM | POA: Diagnosis not present

## 2014-12-12 DIAGNOSIS — K219 Gastro-esophageal reflux disease without esophagitis: Secondary | ICD-10-CM | POA: Diagnosis not present

## 2014-12-12 DIAGNOSIS — O26892 Other specified pregnancy related conditions, second trimester: Secondary | ICD-10-CM | POA: Diagnosis not present

## 2014-12-12 LAB — COMPREHENSIVE METABOLIC PANEL
ALT: 9 U/L — ABNORMAL LOW (ref 14–54)
AST: 11 U/L — ABNORMAL LOW (ref 15–41)
Albumin: 2.6 g/dL — ABNORMAL LOW (ref 3.5–5.0)
Alkaline Phosphatase: 35 U/L — ABNORMAL LOW (ref 38–126)
Anion gap: 6 (ref 5–15)
BUN: 8 mg/dL (ref 6–20)
CO2: 24 mmol/L (ref 22–32)
Calcium: 7.7 mg/dL — ABNORMAL LOW (ref 8.9–10.3)
Chloride: 106 mmol/L (ref 101–111)
Creatinine, Ser: 0.51 mg/dL (ref 0.44–1.00)
GFR calc Af Amer: >60 mL/min (ref >60)
GFR calc non Af Amer: >60 mL/min (ref >60)
Glucose, Bld: 75 mg/dL (ref 65–99)
Potassium: 3.6 mmol/L (ref 3.5–5.1)
Sodium: 136 mmol/L (ref 135–145)
Total Bilirubin: 0.3 mg/dL (ref 0.3–1.2)
Total Protein: 5.3 g/dL — ABNORMAL LOW (ref 6.5–8.1)

## 2014-12-12 LAB — URINALYSIS, DIPSTICK ONLY
Bilirubin Urine: NEGATIVE
Glucose, UA: NEGATIVE mg/dL
Hgb urine dipstick: NEGATIVE
Ketones, ur: 15 mg/dL — AB
Nitrite: NEGATIVE
Protein, ur: NEGATIVE mg/dL
Specific Gravity, Urine: 1.015 (ref 1.005–1.030)
Urobilinogen, UA: 0.2 mg/dL (ref 0.0–1.0)
pH: 6.5 (ref 5.0–8.0)

## 2014-12-12 LAB — AMYLASE: Amylase: 57 U/L (ref 28–100)

## 2014-12-12 LAB — LIPASE, BLOOD: Lipase: 20 U/L — ABNORMAL LOW (ref 22–51)

## 2014-12-12 MED ORDER — PANTOPRAZOLE SODIUM 40 MG PO TBEC
40.0000 mg | DELAYED_RELEASE_TABLET | Freq: Every day | ORAL | Status: DC
  Administered 2014-12-12: 40 mg via ORAL
  Filled 2014-12-12: qty 1

## 2014-12-12 MED ORDER — ONDANSETRON 4 MG PO TBDP: 4.0000 mg | ORAL_TABLET | Freq: Three times a day (TID) | ORAL | Status: DC | PRN

## 2014-12-12 MED ORDER — FAMOTIDINE IN NACL 20-0.9 MG/50ML-% IV SOLN
20.0000 mg | Freq: Two times a day (BID) | INTRAVENOUS | Status: DC
  Administered 2014-12-12: 20 mg via INTRAVENOUS
  Filled 2014-12-12: qty 50

## 2014-12-12 MED ORDER — GI COCKTAIL ~~LOC~~
30.0000 mL | ORAL | Status: AC
  Administered 2014-12-12: 30 mL via ORAL
  Filled 2014-12-12: qty 30

## 2014-12-12 MED ORDER — FAMOTIDINE IN NACL 20-0.9 MG/50ML-% IV SOLN: 20.0000 mg | Freq: Two times a day (BID) | INTRAVENOUS | Status: DC

## 2014-12-12 MED ORDER — ONDANSETRON HCL 4 MG/2ML IJ SOLN
4.0000 mg | Freq: Three times a day (TID) | INTRAMUSCULAR | Status: DC | PRN
  Administered 2014-12-12: 4 mg via INTRAVENOUS
  Filled 2014-12-12: qty 2

## 2014-12-12 MED ORDER — NALBUPHINE HCL 10 MG/ML IJ SOLN
5.0000 mg | INTRAMUSCULAR | Status: DC | PRN
  Administered 2014-12-12: 5 mg via INTRAVENOUS
  Filled 2014-12-12: qty 1

## 2014-12-12 MED ORDER — PANTOPRAZOLE SODIUM 40 MG PO TBEC: 40.0000 mg | DELAYED_RELEASE_TABLET | Freq: Every day | ORAL | Status: DC

## 2014-12-12 NOTE — Progress Notes (Signed)
Patient admitted to room 308 on [facility] with presence of her Interpreter. Familiarized patient with room and call bell and other features of room, went over medications and IV fluids, scheduled labs. Assessed patient pain and nausea concerns. Pt expresses understanding of everything through translation of interpreter. Interpreter left around 0130. Instructed to call anytime 24 hour service.

## 2014-12-12 NOTE — Progress Notes (Signed)
Patient ID: DRAKE LANDING, female   DOB: [birth year], 34 y.o.   MRN: [id number] Taylor Ferguson is a 34 y.o. female admitted for persistent vomiting since Sunday and inability to tolerate any PO intake. C/O severe burning in throat, reflux and upper back pain.  She was able to drink 100 mL of chicken broth this morning.  She reports increased nausea immediately after drinking it, but it subsided somewhat after waiting a while for stomach to settle.  Prenatal History: G4P1003  EDC : 04/30/2015, by LMP Prenatal care at [facility]  Primary Ob Provider: Dr Benjie Karvonen Prenatal course complicated by HX Waardenburg syndrome / deaf (requires translater) / PAT (paroxymal atrial tachycardia) - no meds this pregnancy / GERD  Prenatal Labs: ABO, Rh:  A Negative Antibody:  Negative Rubella:   Immune RPR:   Non-Reactive HBsAg:  Negative  HIV:   Non-Reactive  Medical / Surgical History :  Past medical history:  Past Medical History  Diagnosis Date  . History of chicken pox   . Heart murmur   . Heart palpitations   . Anemia     history of  . Depression   . Waardenburg syndrome      Past surgical history:  Past Surgical History  Procedure Laterality Date  . Eye surgery      bilateral -- Waardenburg Syndrome  . Wisdom tooth extraction      Family History:  Family History  Problem Relation Age of Onset  . Hearing loss Son     deaf  . Stroke Maternal Grandmother   . CAD Maternal Grandfather 58  . CAD Paternal Grandfather 59     Social History:  reports that she has never smoked. She has never used smokeless tobacco. She reports that she drinks about 1.5 oz of alcohol per week. She reports that she does not use illicit drugs.   Allergies: Cinnamon   Current Medications at time of admission:  Prior to Admission medications   Medication Sig Start Date End Date Taking? Authorizing Provider   acetaminophen (TYLENOL) 500 MG tablet Take 1,000 mg by mouth every 6 (six) hours as needed for mild pain.   Yes Historical Provider, MD  calcium carbonate (TUMS - DOSED IN MG ELEMENTAL CALCIUM) 500 MG chewable tablet Chew 2 tablets by mouth daily as needed for indigestion or heartburn.   Yes Historical Provider, MD  ondansetron (ZOFRAN-ODT) 4 MG disintegrating tablet Take 1 tablet by mouth every 8 (eight) hours as needed. 11/05/14  Yes Historical Provider, MD  Prenatal Vit-Fe Fumarate-FA (PRENATAL MULTIVITAMIN) TABS tablet Take 1 tablet by mouth daily at 12 noon.   Yes Historical Provider, MD  ranitidine (ZANTAC) 150 MG tablet Take 150 mg by mouth 2 (two) times daily as needed for heartburn.   Yes Historical Provider, MD    Review of Systems: Active FM Vomiting x 3 days Epigastric pain radiating to back Severe throat burning No bleeding or discharge No cramping Normal BM . No diarrhea Doppler FHT's: 151 bpm  Results for orders placed or performed during the hospital encounter of 12/11/14 (from the past 24 hour(s))  Urinalysis, Routine w reflex microscopic     Status: Abnormal   Collection Time: 12/11/14  7:10 PM  Result Value Ref Range   Color, Urine YELLOW YELLOW   APPearance CLEAR CLEAR   Specific Gravity, Urine >1.030 (H) 1.005 - 1.030   pH 6.0 5.0 - 8.0   Glucose, UA NEGATIVE NEGATIVE mg/dL   Hgb urine dipstick  NEGATIVE NEGATIVE   Bilirubin Urine NEGATIVE NEGATIVE   Ketones, ur >80 (A) NEGATIVE mg/dL   Protein, ur NEGATIVE NEGATIVE mg/dL   Urobilinogen, UA 0.2 0.0 - 1.0 mg/dL   Nitrite NEGATIVE NEGATIVE   Leukocytes, UA NEGATIVE NEGATIVE  CBC     Status: Abnormal   Collection Time: 12/11/14  7:52 PM  Result Value Ref Range   WBC 8.0 4.0 - 10.5 K/uL   RBC 3.71 (L) 3.87 - 5.11 MIL/uL   Hemoglobin 11.5 (L) 12.0 - 15.0 g/dL   HCT 33.0 (L) 36.0 - 46.0 %   MCV 88.9 78.0 - 100.0 fL   MCH 31.0 26.0 - 34.0 pg   MCHC 34.8 30.0 - 36.0 g/dL   RDW 13.5  11.5 - 15.5 %   Platelets 219 150 - 400 K/uL  Comprehensive metabolic panel     Status: Abnormal   Collection Time: 12/11/14  7:52 PM  Result Value Ref Range   Sodium 135 135 - 145 mmol/L   Potassium 3.5 3.5 - 5.1 mmol/L   Chloride 104 101 - 111 mmol/L   CO2 20 (L) 22 - 32 mmol/L   Glucose, Bld 72 65 - 99 mg/dL   BUN 12 6 - 20 mg/dL   Creatinine, Ser 0.56 0.44 - 1.00 mg/dL   Calcium 8.5 (L) 8.9 - 10.3 mg/dL   Total Protein 6.7 6.5 - 8.1 g/dL   Albumin 3.3 (L) 3.5 - 5.0 g/dL   AST 12 (L) 15 - 41 U/L   ALT 12 (L) 14 - 54 U/L   Alkaline Phosphatase 47 38 - 126 U/L   Total Bilirubin 0.8 0.3 - 1.2 mg/dL   GFR calc non Af Amer >60 >60 mL/min   GFR calc Af Amer >60 >60 mL/min   Anion gap 11 5 - 15  Amylase     Status: None   Collection Time: 12/11/14  7:52 PM  Result Value Ref Range   Amylase 76 28 - 100 U/L  Lipase, blood     Status: None   Collection Time: 12/11/14  7:52 PM  Result Value Ref Range   Lipase 23 22 - 51 U/L  Urinalysis, dipstick only     Status: Abnormal   Collection Time: 12/11/14 11:15 PM  Result Value Ref Range   Specific Gravity, Urine 1.020 1.005 - 1.030   pH 6.0 5.0 - 8.0   Glucose, UA 500 (A) NEGATIVE mg/dL   Hgb urine dipstick NEGATIVE NEGATIVE   Bilirubin Urine NEGATIVE NEGATIVE   Ketones, ur >80 (A) NEGATIVE mg/dL   Protein, ur NEGATIVE NEGATIVE mg/dL   Urobilinogen, UA 0.2 0.0 - 1.0 mg/dL   Nitrite NEGATIVE NEGATIVE   Leukocytes, UA NEGATIVE NEGATIVE  Lipase     Status: Abnormal   Collection Time: 12/12/14  5:19 AM  Result Value Ref Range   Lipase 20 (L) 22 - 51 U/L  Comprehensive metabolic panel     Status: Abnormal   Collection Time: 12/12/14  5:19 AM  Result Value Ref Range   Sodium 136 135 - 145 mmol/L   Potassium 3.6 3.5 - 5.1 mmol/L   Chloride 106 101 - 111 mmol/L   CO2 24 22 - 32 mmol/L   Glucose, Bld 75 65 - 99 mg/dL   BUN 8 6 - 20 mg/dL   Creatinine, Ser 0.51 0.44 - 1.00 mg/dL   Calcium 7.7 (L) 8.9 - 10.3 mg/dL   Total Protein 5.3  (L) 6.5 - 8.1 g/dL   Albumin 2.6 (  L) 3.5 - 5.0 g/dL   AST 11 (L) 15 - 41 U/L   ALT 9 (L) 14 - 54 U/L   Alkaline Phosphatase 35 (L) 38 - 126 U/L   Total Bilirubin 0.3 0.3 - 1.2 mg/dL   GFR calc non Af Amer >60 >60 mL/min   GFR calc Af Amer >60 >60 mL/min   Anion gap 6 5 - 15  Amylase     Status: None   Collection Time: 12/12/14  5:19 AM  Result Value Ref Range   Amylase 57 28 - 100 U/L  Urinalysis, dipstick only     Status: Abnormal   Collection Time: 12/12/14  8:11 AM  Result Value Ref Range   Specific Gravity, Urine 1.015 1.005 - 1.030   pH 6.5 5.0 - 8.0   Glucose, UA NEGATIVE NEGATIVE mg/dL   Hgb urine dipstick NEGATIVE NEGATIVE   Bilirubin Urine NEGATIVE NEGATIVE   Ketones, ur 15 (A) NEGATIVE mg/dL   Protein, ur NEGATIVE NEGATIVE mg/dL   Urobilinogen, UA 0.2 0.0 - 1.0 mg/dL   Nitrite NEGATIVE NEGATIVE   Leukocytes, UA SMALL (A) NEGATIVE    Physical Exam:   Today's Vitals   12/12/14 0547 12/12/14 0555 12/12/14 0655 12/12/14 0923  BP: 103/61     Pulse: 70     Temp: 98.6 F (37 C)     TempSrc: Oral     Resp: 18     Height:      Weight: 66.821 kg (147 lb 5 oz)     SpO2: 100%     PainSc:  Asleep 1  3   Height 5\' 6"  (1.676 m), Weight 71.033 kg (156 lb 9.6 oz).  General: alert and oriented, appears general malaise / pale Heart: RRR Lungs: Clear lung fields Abdomen: Gravid, soft, non-distended, mildly tender upper quadrant, hypoactive bowel sounds  uterus: at umbilicus & non-tender  Genitalia / VE:  deferrred Extremities: no edema   Assessment: [redacted] weeks gestation Dehydration secondary to persistent vomiting x 3 days Epigastric pain - possible gallbladder etiology versus reflux GERD   Plan: Admit for supportive care and IVF hydration Continue: Pepcid 20mg  IV Q 12 / Zofran 4mg  IV prn vomiting  Continue: Protonix 40mg  30 mins prior to breakfast daily / if not improved after 3 days, take Protonix 40 mg 30 mins before breakfast and 30 mins  before dinner Clear liquids tonight as tolerated CMP/amylase / lipase / urine dip - labs stable & improving will advance diet as tolerate with low fat diet Low fat diet x 2 weeks DC home today F/U with Dr. Benjie Karvonen on Tuesday - pt to schedule prior to d/c Ok to travel to [place] / take copy of prenatal record / travel precautions reviewed  **Dr Ronita Hipps updated on lab results, stable for d/c home and request to travel to [place] - agrees ok for d/c recommend Rx'ing Protonix 40 mg 30 mins prior to breakfast x 2-3 days, if no improvement        Taylor Congress MSN, CNM 12/12/2014 9:56 AM

## 2014-12-12 NOTE — Progress Notes (Signed)
Discharge instructions reviewed with patient and her husband at 10:30am by Dr. Ronita Hipps with help of sign language interpreter, Juliann Pulse, who had to leave after MD rounds.    RN reviewed discharge papers with patient and husband.  RN offered to get another sign language interpreter to review papers and pt declined verbalizing that she was fine.  Pt and husband both utilize speech as well as sign language.  Pt discharged to home with husband.  Condition stable.  Pt ambulated to car with RN. No equipment for home ordered at discharge.

## 2014-12-12 NOTE — Discharge Instructions (Signed)
Dehydration, Adult Dehydration is when you lose more fluids from the body than you take in. Vital organs like the kidneys, brain, and heart cannot function without a proper amount of fluids and salt. Any loss of fluids from the body can cause dehydration.  CAUSES   Vomiting.  Diarrhea.  Excessive sweating.  Excessive urine output.  Fever. SYMPTOMS  Mild dehydration  Thirst.  Dry lips.  Slightly dry mouth. Moderate dehydration  Very dry mouth.  Sunken eyes.  Skin does not bounce back quickly when lightly pinched and released.  Dark urine and decreased urine production.  Decreased tear production.  Headache. Severe dehydration  Very dry mouth.  Extreme thirst.  Rapid, weak pulse (more than 100 beats per minute at rest).  Cold hands and feet.  Not able to sweat in spite of heat and temperature.  Rapid breathing.  Blue lips.  Confusion and lethargy.  Difficulty being awakened.  Minimal urine production.  No tears. DIAGNOSIS  Your caregiver will diagnose dehydration based on your symptoms and your exam. Blood and urine tests will help confirm the diagnosis. The diagnostic evaluation should also identify the cause of dehydration. TREATMENT  Treatment of mild or moderate dehydration can often be done at home by increasing the amount of fluids that you drink. It is best to drink small amounts of fluid more often. Drinking too much at one time can make vomiting worse. Refer to the home care instructions below. Severe dehydration needs to be treated at the hospital where you will probably be given intravenous (IV) fluids that contain water and electrolytes. HOME CARE INSTRUCTIONS   Ask your caregiver about specific rehydration instructions.  Drink enough fluids to keep your urine clear or pale yellow.  Drink small amounts frequently if you have nausea and vomiting.  Eat as you normally do.  Avoid:  Foods or drinks high in sugar.  Carbonated  drinks.  Juice.  Extremely hot or cold fluids.  Drinks with caffeine.  Fatty, greasy foods.  Alcohol.  Tobacco.  Overeating.  Gelatin desserts.  Wash your hands well to avoid spreading bacteria and viruses.  Only take over-the-counter or prescription medicines for pain, discomfort, or fever as directed by your caregiver.  Ask your caregiver if you should continue all prescribed and over-the-counter medicines.  Keep all follow-up appointments with your caregiver. SEEK MEDICAL CARE IF:  You have abdominal pain and it increases or stays in one area (localizes).  You have a rash, stiff neck, or severe headache.  You are irritable, sleepy, or difficult to awaken.  You are weak, dizzy, or extremely thirsty. SEEK IMMEDIATE MEDICAL CARE IF:   You are unable to keep fluids down or you get worse despite treatment.  You have frequent episodes of vomiting or diarrhea.  You have blood or green matter (bile) in your vomit.  You have blood in your stool or your stool looks black and tarry.  You have not urinated in 6 to 8 hours, or you have only urinated a small amount of very dark urine.  You have a fever.  You faint. MAKE SURE YOU:   Understand these instructions.  Will watch your condition.  Will get help right away if you are not doing well or get worse. Document Released: 07/13/2005 Document Revised: 10/05/2011 Document Reviewed: 03/02/2011 [facility] Patient Information 2015 [place], [place]. This information is not intended to replace advice given to you by your health care provider. Make sure you discuss any questions you have with your health care  provider. Low-Fat Diet for Pancreatitis or Gallbladder Conditions A low-fat diet can be helpful if you have pancreatitis or a gallbladder condition. With these conditions, your pancreas and gallbladder have trouble digesting fats. A healthy eating plan with less fat will help rest your pancreas and gallbladder and reduce  your symptoms. WHAT DO I NEED TO KNOW ABOUT THIS DIET?  Eat a low-fat diet.  Reduce your fat intake to less than 20-30% of your total daily calories. This is less than 50-60 g of fat per day.  Remember that you need some fat in your diet. Ask your dietician what your daily goal should be.  Choose nonfat and low-fat healthy foods. Look for the words "nonfat," "low fat," or "fat free."  As a guide, look on the label and choose foods with less than 3 g of fat per serving. Eat only one serving.  Avoid alcohol.  Do not smoke. If you need help quitting, talk with your health care provider.  Eat small frequent meals instead of three large heavy meals. WHAT FOODS CAN I EAT? Grains Include healthy grains and starches such as potatoes, wheat bread, fiber-rich cereal, and brown rice. Choose whole grain options whenever possible. In adults, whole grains should account for 45-65% of your daily calories.  Fruits and Vegetables Eat plenty of fruits and vegetables. Fresh fruits and vegetables add fiber to your diet. Meats and Other Protein Sources Eat lean meat such as chicken and pork. Trim any fat off of meat before cooking it. Eggs, fish, and beans are other sources of protein. In adults, these foods should account for 10-35% of your daily calories. Dairy Choose low-fat milk and dairy options. Dairy includes fat and protein, as well as calcium.  Fats and Oils Limit high-fat foods such as fried foods, sweets, baked goods, sugary drinks.  Other Creamy sauces and condiments, such as mayonnaise, can add extra fat. Think about whether or not you need to use them, or use smaller amounts or low fat options. WHAT FOODS ARE NOT RECOMMENDED?  High fat foods, such as:  [employer].  Ice cream.  [place] toast.  Sweet rolls.  Pizza.  Cheese bread.  Foods covered with batter, butter, creamy sauces, or cheese.  Fried foods.  Sugary drinks and desserts.  Foods that cause gas or  bloating Document Released: 07/18/2013 Document Reviewed: 07/18/2013 [facility] Patient Information 2015 [place], [place]. This information is not intended to replace advice given to you by your health care provider. Make sure you discuss any questions you have with your health care provider. Gastroesophageal Reflux Disease, Adult Gastroesophageal reflux disease (GERD) happens when acid from your stomach flows up into the esophagus. When acid comes in contact with the esophagus, the acid causes soreness (inflammation) in the esophagus. Over time, GERD may create small holes (ulcers) in the lining of the esophagus. CAUSES   Increased body weight. This puts pressure on the stomach, making acid rise from the stomach into the esophagus.  Smoking. This increases acid production in the stomach.  Drinking alcohol. This causes decreased pressure in the lower esophageal sphincter (valve or ring of muscle between the esophagus and stomach), allowing acid from the stomach into the esophagus.  Late evening meals and a full stomach. This increases pressure and acid production in the stomach.  A malformed lower esophageal sphincter. Sometimes, no cause is found. SYMPTOMS   Burning pain in the lower part of the mid-chest behind the breastbone and in the mid-stomach area. This may occur twice a  week or more often.  Trouble swallowing.  Sore throat.  Dry cough.  Asthma-like symptoms including chest tightness, shortness of breath, or wheezing. DIAGNOSIS  Your caregiver may be able to diagnose GERD based on your symptoms. In some cases, X-rays and other tests may be done to check for complications or to check the condition of your stomach and esophagus. TREATMENT  Your caregiver may recommend over-the-counter or prescription medicines to help decrease acid production. Ask your caregiver before starting or adding any new medicines.  HOME CARE INSTRUCTIONS   Change the factors that you can control. Ask  your caregiver for guidance concerning weight loss, quitting smoking, and alcohol consumption.  Avoid foods and drinks that make your symptoms worse, such as:  Caffeine or alcoholic drinks.  Chocolate.  Peppermint or mint flavorings.  Garlic and onions.  Spicy foods.  Citrus fruits, such as oranges, lemons, or limes.  Tomato-based foods such as sauce, chili, salsa, and pizza.  Fried and fatty foods.  Avoid lying down for the 3 hours prior to your bedtime or prior to taking a nap.  Eat small, frequent meals instead of large meals.  Wear loose-fitting clothing. Do not wear anything tight around your waist that causes pressure on your stomach.  Raise the head of your bed 6 to 8 inches with wood blocks to help you sleep. Extra pillows will not help.  Only take over-the-counter or prescription medicines for pain, discomfort, or fever as directed by your caregiver.  Do not take aspirin, ibuprofen, or other nonsteroidal anti-inflammatory drugs (NSAIDs). SEEK IMMEDIATE MEDICAL CARE IF:   You have pain in your arms, neck, jaw, teeth, or back.  Your pain increases or changes in intensity or duration.  You develop nausea, vomiting, or sweating (diaphoresis).  You develop shortness of breath, or you faint.  Your vomit is green, yellow, black, or looks like coffee grounds or blood.  Your stool is red, bloody, or black. These symptoms could be signs of other problems, such as heart disease, gastric bleeding, or esophageal bleeding. MAKE SURE YOU:   Understand these instructions.  Will watch your condition.  Will get help right away if you are not doing well or get worse. Document Released: 04/22/2005 Document Revised: 10/05/2011 Document Reviewed: 01/30/2011 [facility] Patient Information 2015 [place], [place]. This information is not intended to replace advice given to you by your health care provider. Make sure you discuss any questions you have with your health care  provider.

## 2014-12-12 NOTE — Discharge Summary (Signed)
Physician Discharge Summary  Patient ID: Taylor Ferguson MRN: [id number] DOB/AGE: October 12, 1980 33 y.o.  Admit date: 12/11/2014 Discharge date: 12/12/2014  Admission Diagnoses: 20 weeks / dehydration after 3 days of persistent vomiting / epigastric pain / GERD  Discharge Diagnoses:  Active Problems:   Epigastric abdominal pain   Dehydration  Discharged Condition: stable   Significant Diagnostic Studies: radiology: Ultrasound: gallbladder  Treatments: IV hydration and analgesia: Nubain  Discharge Exam: Blood pressure 103/61, pulse 70, temperature 98.6 F (37 C), temperature source Oral, resp. rate 18, height 5\' 6"  (1.676 m), weight 66.821 kg (147 lb 5 oz), SpO2 100 %. General appearance: alert, cooperative, fatigued, no distress and pale Resp: clear to auscultation bilaterally Cardio: regular rate and rhythm, S1, S2 normal, no murmur, click, rub or gallop GI: normal findings: soft, non-tender and hypoactive bowel sounds Extremities: extremities normal, atraumatic, no cyanosis or edema and no edema, redness or tenderness in the calves or thighs Skin: Skin color, texture, turgor normal. No rashes or lesions   Disposition: 01-Home or Self Care  Ok to road travel to [place] / travel precautions given: stay well-hydrated, make frequent stops about every 2-3 hours, get out and walk around while stopped, get copy of prenatal record to take with you while travelling     Medication List    TAKE these medications        acetaminophen 500 MG tablet  Commonly known as:  TYLENOL  Take 1,000 mg by mouth every 6 (six) hours as needed for mild pain.     calcium carbonate 500 MG chewable tablet  Commonly known as:  TUMS - dosed in mg elemental calcium  Chew 2 tablets by mouth daily as needed for indigestion or heartburn.     ondansetron 4 MG disintegrating tablet  Commonly known as:  ZOFRAN-ODT  Take 1 tablet by mouth every 8 (eight) hours as needed.     ondansetron 4 MG  disintegrating tablet  Commonly known as:  ZOFRAN ODT  Take 1 tablet (4 mg total) by mouth every 8 (eight) hours as needed for nausea or vomiting.     pantoprazole 40 MG tablet  Commonly known as:  PROTONIX  Take 1 tablet (40 mg total) by mouth daily before breakfast.     prenatal multivitamin Tabs tablet  Take 1 tablet by mouth daily at 12 noon.     ranitidine 150 MG tablet  Commonly known as:  ZANTAC  Take 150 mg by mouth 2 (two) times daily as needed for heartburn.           Follow-up Information    Follow up with MODY,VAISHALI R, MD. Schedule an appointment as soon as possible for a visit on 12/18/2014.   Specialty:  Obstetrics and Gynecology   Why:  re-evaluation    Contact information:   [place] [address] [phone]       Signed: Laury Deep, Jerilynn Mages MSN, CNM 12/12/2014, 10:54 AM

## 2014-12-25 DIAGNOSIS — Z3A22 22 weeks gestation of pregnancy: Secondary | ICD-10-CM | POA: Diagnosis not present

## 2014-12-25 DIAGNOSIS — O4402 Placenta previa specified as without hemorrhage, second trimester: Secondary | ICD-10-CM | POA: Diagnosis not present

## 2015-01-21 DIAGNOSIS — Z3A25 25 weeks gestation of pregnancy: Secondary | ICD-10-CM | POA: Diagnosis not present

## 2015-01-21 DIAGNOSIS — O4402 Placenta previa specified as without hemorrhage, second trimester: Secondary | ICD-10-CM | POA: Diagnosis not present

## 2015-01-21 DIAGNOSIS — O26892 Other specified pregnancy related conditions, second trimester: Secondary | ICD-10-CM | POA: Diagnosis not present

## 2015-01-21 DIAGNOSIS — O352XX0 Maternal care for (suspected) hereditary disease in fetus, not applicable or unspecified: Secondary | ICD-10-CM | POA: Diagnosis not present

## 2015-02-04 DIAGNOSIS — Z3482 Encounter for supervision of other normal pregnancy, second trimester: Secondary | ICD-10-CM | POA: Diagnosis not present

## 2015-02-04 DIAGNOSIS — Z36 Encounter for antenatal screening of mother: Secondary | ICD-10-CM | POA: Diagnosis not present

## 2015-02-04 DIAGNOSIS — O360120 Maternal care for anti-D [Rh] antibodies, second trimester, not applicable or unspecified: Secondary | ICD-10-CM | POA: Diagnosis not present

## 2015-02-04 DIAGNOSIS — Z3A27 27 weeks gestation of pregnancy: Secondary | ICD-10-CM | POA: Diagnosis not present

## 2015-02-04 DIAGNOSIS — Z23 Encounter for immunization: Secondary | ICD-10-CM | POA: Diagnosis not present

## 2015-04-02 DIAGNOSIS — O4403 Placenta previa specified as without hemorrhage, third trimester: Secondary | ICD-10-CM | POA: Diagnosis not present

## 2015-04-02 DIAGNOSIS — Z3483 Encounter for supervision of other normal pregnancy, third trimester: Secondary | ICD-10-CM | POA: Diagnosis not present

## 2015-04-02 DIAGNOSIS — Z36 Encounter for antenatal screening of mother: Secondary | ICD-10-CM | POA: Diagnosis not present

## 2015-04-02 DIAGNOSIS — Z3A36 36 weeks gestation of pregnancy: Secondary | ICD-10-CM | POA: Diagnosis not present

## 2015-04-02 LAB — OB RESULTS CONSOLE GBS: STREP GROUP B AG: POSITIVE

## 2015-04-13 ENCOUNTER — Encounter (HOSPITAL_COMMUNITY): Payer: Self-pay | Admitting: *Deleted

## 2015-04-13 ENCOUNTER — Inpatient Hospital Stay (HOSPITAL_COMMUNITY)
Admission: AD | Admit: 2015-04-13 | Discharge: 2015-04-15 | DRG: 767 | Disposition: A | Discharge status: Home / Self Care | Payer: Commercial Managed Care - HMO | Source: Ambulatory Visit | Attending: Obstetrics & Gynecology | Admitting: Obstetrics & Gynecology

## 2015-04-13 DIAGNOSIS — F329 Major depressive disorder, single episode, unspecified: Secondary | ICD-10-CM | POA: Diagnosis not present

## 2015-04-13 DIAGNOSIS — O99344 Other mental disorders complicating childbirth: Secondary | ICD-10-CM | POA: Diagnosis present

## 2015-04-13 DIAGNOSIS — R011 Cardiac murmur, unspecified: Secondary | ICD-10-CM | POA: Diagnosis present

## 2015-04-13 DIAGNOSIS — Z822 Family history of deafness and hearing loss: Secondary | ICD-10-CM | POA: Diagnosis not present

## 2015-04-13 DIAGNOSIS — O9902 Anemia complicating childbirth: Secondary | ICD-10-CM | POA: Diagnosis present

## 2015-04-13 DIAGNOSIS — Z8249 Family history of ischemic heart disease and other diseases of the circulatory system: Secondary | ICD-10-CM

## 2015-04-13 DIAGNOSIS — O4292 Full-term premature rupture of membranes, unspecified as to length of time between rupture and onset of labor: Secondary | ICD-10-CM | POA: Diagnosis not present

## 2015-04-13 DIAGNOSIS — Z302 Encounter for sterilization: Secondary | ICD-10-CM | POA: Diagnosis not present

## 2015-04-13 DIAGNOSIS — Z3490 Encounter for supervision of normal pregnancy, unspecified, unspecified trimester: Secondary | ICD-10-CM

## 2015-04-13 DIAGNOSIS — R002 Palpitations: Secondary | ICD-10-CM | POA: Diagnosis not present

## 2015-04-13 DIAGNOSIS — R94120 Abnormal auditory function study: Secondary | ICD-10-CM | POA: Diagnosis not present

## 2015-04-13 DIAGNOSIS — F419 Anxiety disorder, unspecified: Secondary | ICD-10-CM | POA: Diagnosis present

## 2015-04-13 DIAGNOSIS — Z23 Encounter for immunization: Secondary | ICD-10-CM | POA: Diagnosis not present

## 2015-04-13 DIAGNOSIS — H9190 Unspecified hearing loss, unspecified ear: Secondary | ICD-10-CM | POA: Diagnosis not present

## 2015-04-13 DIAGNOSIS — Z9851 Tubal ligation status: Secondary | ICD-10-CM

## 2015-04-13 DIAGNOSIS — Q798 Other congenital malformations of musculoskeletal system: Secondary | ICD-10-CM | POA: Diagnosis not present

## 2015-04-13 DIAGNOSIS — O99824 Streptococcus B carrier state complicating childbirth: Secondary | ICD-10-CM | POA: Diagnosis present

## 2015-04-13 DIAGNOSIS — Z3A37 37 weeks gestation of pregnancy: Secondary | ICD-10-CM | POA: Diagnosis present

## 2015-04-13 DIAGNOSIS — O4290 Premature rupture of membranes, unspecified as to length of time between rupture and onset of labor, unspecified weeks of gestation: Secondary | ICD-10-CM | POA: Diagnosis present

## 2015-04-13 HISTORY — DX: Tubal ligation status: Z98.51

## 2015-04-13 LAB — CBC
HEMATOCRIT: 29.9 % — AB (ref 36.0–46.0)
HEMOGLOBIN: 9.7 g/dL — AB (ref 12.0–15.0)
MCH: 27.6 pg (ref 26.0–34.0)
MCHC: 32.4 g/dL (ref 30.0–36.0)
MCV: 85.2 fL (ref 78.0–100.0)
Platelets: 308 10*3/uL (ref 150–400)
RBC: 3.51 MIL/uL — AB (ref 3.87–5.11)
RDW: 14 % (ref 11.5–15.5)
WBC: 10.7 10*3/uL — ABNORMAL HIGH (ref 4.0–10.5)

## 2015-04-13 MED ORDER — LIDOCAINE HCL (PF) 1 % IJ SOLN: 30.0000 mL | INTRAMUSCULAR | Status: DC | PRN

## 2015-04-13 MED ORDER — OXYTOCIN BOLUS FROM INFUSION: 500.0000 mL | INTRAVENOUS | Status: DC

## 2015-04-13 MED ORDER — CITRIC ACID-SODIUM CITRATE 334-500 MG/5ML PO SOLN: 30.0000 mL | ORAL | Status: DC | PRN

## 2015-04-13 MED ORDER — OXYCODONE-ACETAMINOPHEN 5-325 MG PO TABS: 2.0000 | ORAL_TABLET | ORAL | Status: DC | PRN

## 2015-04-13 MED ORDER — PENICILLIN G POTASSIUM 5000000 UNITS IJ SOLR
5.0000 10*6.[IU] | Freq: Once | INTRAVENOUS | Status: AC
  Administered 2015-04-13: 5 10*6.[IU] via INTRAVENOUS
  Filled 2015-04-13: qty 5

## 2015-04-13 MED ORDER — FLEET ENEMA 7-19 GM/118ML RE ENEM: 1.0000 | ENEMA | Freq: Every day | RECTAL | Status: DC | PRN

## 2015-04-13 MED ORDER — PENICILLIN G POTASSIUM 5000000 UNITS IJ SOLR
2.5000 10*6.[IU] | INTRAVENOUS | Status: DC
  Administered 2015-04-13: 2.5 10*6.[IU] via INTRAVENOUS
  Filled 2015-04-13 (×5): qty 2.5

## 2015-04-13 MED ORDER — ACETAMINOPHEN 325 MG PO TABS: 650.0000 mg | ORAL_TABLET | ORAL | Status: DC | PRN

## 2015-04-13 MED ORDER — TERBUTALINE SULFATE 1 MG/ML IJ SOLN: 0.2500 mg | Freq: Once | INTRAMUSCULAR | Status: DC | PRN

## 2015-04-13 MED ORDER — OXYTOCIN 40 UNITS IN LACTATED RINGERS INFUSION - SIMPLE MED: 62.5000 mL/h | INTRAVENOUS | Status: DC

## 2015-04-13 MED ORDER — BUTORPHANOL TARTRATE 1 MG/ML IJ SOLN
1.0000 mg | Freq: Once | INTRAMUSCULAR | Status: DC
  Filled 2015-04-13: qty 1

## 2015-04-13 MED ORDER — OXYTOCIN 40 UNITS IN LACTATED RINGERS INFUSION - SIMPLE MED
1.0000 m[IU]/min | INTRAVENOUS | Status: DC
  Administered 2015-04-13: 2 m[IU]/min via INTRAVENOUS
  Filled 2015-04-13: qty 1000

## 2015-04-13 MED ORDER — OXYCODONE-ACETAMINOPHEN 5-325 MG PO TABS: 1.0000 | ORAL_TABLET | ORAL | Status: DC | PRN

## 2015-04-13 MED ORDER — ONDANSETRON HCL 4 MG/2ML IJ SOLN
4.0000 mg | Freq: Four times a day (QID) | INTRAMUSCULAR | Status: DC | PRN
Start: 2015-04-13 — End: 2015-04-14

## 2015-04-13 MED ORDER — LACTATED RINGERS IV SOLN
INTRAVENOUS | Status: DC
  Administered 2015-04-13: 20:00:00 via INTRAVENOUS

## 2015-04-13 MED ORDER — LACTATED RINGERS IV SOLN: 500.0000 mL | INTRAVENOUS | Status: DC | PRN

## 2015-04-13 NOTE — H&P (Addendum)
Taylor Ferguson is a 34 y.o. female presenting for SROM since 10 am, clear fluid. 37.4 wks. 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 Good FMs.  3 SVDs. 2 boys have same syndrome, one girl does not.  [facility], previa resolved, then low lying placenta but resolved in 3rd trim,   History OB History    Gravida Para Term Preterm AB TAB SAB Ectopic Multiple Living   5 3 1  0 1 0 1 0 0 3     Past Medical History  Diagnosis Date  . History of chicken pox   . Heart murmur   . Heart palpitations   . Anemia     history of  . Depression   . Waardenburg syndrome    Past Surgical History  Procedure Laterality Date  . Eye surgery      bilateral -- Waardenburg Syndrome  . Wisdom tooth extraction     Family History: family history includes CAD (age of onset: 62) in her paternal grandfather; CAD (age of onset: 91) in her maternal grandfather; Hearing loss in her son; Stroke in her maternal grandmother. Social History:  reports that she has never smoked. She has never used smokeless tobacco. She reports that she drinks about 1.5 oz of alcohol per week. She reports that she does not use illicit drugs.   Prenatal Transfer Tool  Maternal Diabetes: No Genetic Screening: QUAD neg.  Maternal Ultrasounds/Referrals: Normal except suspect same Syndrome Fetal Ultrasounds or other Referrals:  None Maternal Substance Abuse:  No Significant Maternal Medications:  Meds include: Other: Pepcid Significant Maternal Lab Results:  Lab values include: Group B Strep positive  Rh negative Other Comments:  None  ROS  Neg   Dilation: 4 Effacement (%): 60 Station: -2 Exam by:: DR. Benjie Karvonen Blood pressure 131/81, pulse 89, temperature 98.6 F (37 C), temperature source Oral, resp. rate 18, height 5\' 6"  (1.676 m), weight 168 lb (76.204 kg), unknown if currently breastfeeding. Exam Physical Exam  Physical exam:  A&O x 3, no acute distress. Pleasant HEENT neg, no thyromegaly Lungs  CTA bilat CV RRR, S1S2 normal Abdo soft, non tender, non acute Extr no edema/ tenderness Pelvic 4/50%, VTX, forebag AROM, bloody fluid noted. IUPC placed carefuly  FHT 135/ category I Toco q 3 -5 min.  Prenatal labs: ABO, Rh: --/--/A NEG (09/17 1815) Antibody: POS (09/17 1815) Rubella: Nonimmune (02/25 0000) RPR: Nonreactive (02/25 0000)  HBsAg: Negative (02/25 0000)  HIV: Non-reactive (02/25 0000)  GBS: Positive (09/06 0000)   Assessment/Plan: SROM 10 am, term pregnnacy. IV pan peds and epidural options reviewed. GBS coverage with PCN ongoing, anticipate SVD.     MODY,VAISHALI R 04/13/2015, 11:45 PM

## 2015-04-13 NOTE — Progress Notes (Signed)
Had a call from Ms. Ricard Dillon Youth worker for [employer]) about the need for an interpretor . Patient  Wants an interpretor around the clock. Presently Ms. Carlisle Cater is doing the interpretation.

## 2015-04-13 NOTE — MAU Note (Signed)
Patient presents with leaking fluid, around 10:30 am felt leaking then continued to leak, clear fluid, denies pain, all previous vaginal births, no problems during pregnancy.

## 2015-04-14 ENCOUNTER — Encounter (HOSPITAL_COMMUNITY): Payer: Self-pay | Admitting: Anesthesiology

## 2015-04-14 ENCOUNTER — Inpatient Hospital Stay (HOSPITAL_COMMUNITY): Payer: Commercial Managed Care - HMO

## 2015-04-14 ENCOUNTER — Encounter (HOSPITAL_COMMUNITY)
Admission: AD | Disposition: A | Discharge status: Home / Self Care | Payer: Self-pay | Source: Ambulatory Visit | Attending: Obstetrics & Gynecology

## 2015-04-14 ENCOUNTER — Inpatient Hospital Stay (HOSPITAL_COMMUNITY): Payer: Commercial Managed Care - HMO | Admitting: Anesthesiology

## 2015-04-14 DIAGNOSIS — R94120 Abnormal auditory function study: Secondary | ICD-10-CM | POA: Diagnosis not present

## 2015-04-14 DIAGNOSIS — Z23 Encounter for immunization: Secondary | ICD-10-CM | POA: Diagnosis not present

## 2015-04-14 HISTORY — PX: TUBAL LIGATION: SHX77

## 2015-04-14 LAB — CBC
HCT: 26.1 % — ABNORMAL LOW (ref 36.0–46.0)
HEMOGLOBIN: 8.4 g/dL — AB (ref 12.0–15.0)
MCH: 27.4 pg (ref 26.0–34.0)
MCHC: 32.2 g/dL (ref 30.0–36.0)
MCV: 85 fL (ref 78.0–100.0)
PLATELETS: 254 10*3/uL (ref 150–400)
RBC: 3.07 MIL/uL — ABNORMAL LOW (ref 3.87–5.11)
RDW: 13.9 % (ref 11.5–15.5)
WBC: 14.5 10*3/uL — ABNORMAL HIGH (ref 4.0–10.5)

## 2015-04-14 LAB — RPR: RPR: NONREACTIVE

## 2015-04-14 LAB — MRSA PCR SCREENING: MRSA BY PCR: NEGATIVE

## 2015-04-14 SURGERY — LIGATION, FALLOPIAN TUBE, POSTPARTUM
Anesthesia: Epidural | Site: Abdomen | Laterality: Bilateral

## 2015-04-14 MED ORDER — LACTATED RINGERS IV SOLN
INTRAVENOUS | Status: DC | PRN
  Administered 2015-04-14: 12:00:00 via INTRAVENOUS

## 2015-04-14 MED ORDER — FENTANYL CITRATE (PF) 100 MCG/2ML IJ SOLN
INTRAMUSCULAR | Status: DC | PRN
  Administered 2015-04-14 (×2): 50 ug via INTRAVENOUS

## 2015-04-14 MED ORDER — FENTANYL CITRATE (PF) 100 MCG/2ML IJ SOLN: 25.0000 ug | INTRAMUSCULAR | Status: DC | PRN

## 2015-04-14 MED ORDER — ONDANSETRON HCL 4 MG/2ML IJ SOLN
INTRAMUSCULAR | Status: AC
  Filled 2015-04-14: qty 2

## 2015-04-14 MED ORDER — WITCH HAZEL-GLYCERIN EX PADS: 1.0000 "application " | MEDICATED_PAD | CUTANEOUS | Status: DC | PRN

## 2015-04-14 MED ORDER — LIDOCAINE HCL (PF) 1 % IJ SOLN
INTRAMUSCULAR | Status: DC | PRN
  Administered 2015-04-14 (×2): 8 mL via EPIDURAL

## 2015-04-14 MED ORDER — METOCLOPRAMIDE HCL 10 MG PO TABS
10.0000 mg | ORAL_TABLET | Freq: Once | ORAL | Status: AC
  Administered 2015-04-14: 10 mg via ORAL
  Filled 2015-04-14: qty 1

## 2015-04-14 MED ORDER — ACETAMINOPHEN 325 MG PO TABS: 650.0000 mg | ORAL_TABLET | ORAL | Status: DC | PRN

## 2015-04-14 MED ORDER — CEFAZOLIN SODIUM-DEXTROSE 2-3 GM-% IV SOLR
INTRAVENOUS | Status: DC | PRN
  Administered 2015-04-14: 2 g via INTRAVENOUS

## 2015-04-14 MED ORDER — SODIUM CHLORIDE 0.9 % IJ SOLN: 3.0000 mL | INTRAMUSCULAR | Status: DC | PRN

## 2015-04-14 MED ORDER — LANOLIN HYDROUS EX OINT: TOPICAL_OINTMENT | CUTANEOUS | Status: DC | PRN

## 2015-04-14 MED ORDER — ONDANSETRON HCL 4 MG/2ML IJ SOLN: 4.0000 mg | INTRAMUSCULAR | Status: DC | PRN

## 2015-04-14 MED ORDER — DIBUCAINE 1 % RE OINT
1.0000 "application " | TOPICAL_OINTMENT | RECTAL | Status: DC | PRN
  Filled 2015-04-14: qty 28

## 2015-04-14 MED ORDER — FENTANYL CITRATE (PF) 100 MCG/2ML IJ SOLN
INTRAMUSCULAR | Status: AC
  Filled 2015-04-14: qty 4

## 2015-04-14 MED ORDER — FENTANYL 2.5 MCG/ML BUPIVACAINE 1/10 % EPIDURAL INFUSION (WH - ANES)
14.0000 mL/h | INTRAMUSCULAR | Status: DC | PRN
  Administered 2015-04-14 (×2): 14 mL/h via EPIDURAL
  Filled 2015-04-14: qty 125

## 2015-04-14 MED ORDER — INFLUENZA VAC SPLIT QUAD 0.5 ML IM SUSY
0.5000 mL | PREFILLED_SYRINGE | INTRAMUSCULAR | Status: AC
  Administered 2015-04-14: 0.5 mL via INTRAMUSCULAR
  Filled 2015-04-14: qty 0.5

## 2015-04-14 MED ORDER — CEFAZOLIN SODIUM-DEXTROSE 2-3 GM-% IV SOLR
2.0000 g | INTRAVENOUS | Status: DC
Start: 2015-04-14 — End: 2015-04-14

## 2015-04-14 MED ORDER — BUPIVACAINE HCL (PF) 0.25 % IJ SOLN
INTRAMUSCULAR | Status: AC
  Filled 2015-04-14: qty 30

## 2015-04-14 MED ORDER — LIDOCAINE-EPINEPHRINE (PF) 2 %-1:200000 IJ SOLN
INTRAMUSCULAR | Status: AC
  Filled 2015-04-14: qty 20

## 2015-04-14 MED ORDER — EPHEDRINE 5 MG/ML INJ: 10.0000 mg | INTRAVENOUS | Status: DC | PRN

## 2015-04-14 MED ORDER — ONDANSETRON HCL 4 MG/2ML IJ SOLN: 4.0000 mg | Freq: Once | INTRAMUSCULAR | Status: DC | PRN

## 2015-04-14 MED ORDER — TETANUS-DIPHTH-ACELL PERTUSSIS 5-2.5-18.5 LF-MCG/0.5 IM SUSP: 0.5000 mL | Freq: Once | INTRAMUSCULAR | Status: DC

## 2015-04-14 MED ORDER — ONDANSETRON HCL 4 MG/2ML IJ SOLN
INTRAMUSCULAR | Status: DC | PRN
  Administered 2015-04-14: 4 mg via INTRAVENOUS

## 2015-04-14 MED ORDER — SODIUM CHLORIDE 0.9 % IV SOLN: 250.0000 mL | INTRAVENOUS | Status: DC | PRN

## 2015-04-14 MED ORDER — LACTATED RINGERS IV SOLN
INTRAVENOUS | Status: DC
  Administered 2015-04-14: 10:00:00 via INTRAVENOUS

## 2015-04-14 MED ORDER — DIPHENHYDRAMINE HCL 25 MG PO CAPS: 25.0000 mg | ORAL_CAPSULE | Freq: Four times a day (QID) | ORAL | Status: DC | PRN

## 2015-04-14 MED ORDER — PHENYLEPHRINE 40 MCG/ML (10ML) SYRINGE FOR IV PUSH (FOR BLOOD PRESSURE SUPPORT)
80.0000 ug | PREFILLED_SYRINGE | INTRAVENOUS | Status: DC | PRN
Start: 2015-04-14 — End: 2015-04-14
  Filled 2015-04-14: qty 20

## 2015-04-14 MED ORDER — ONDANSETRON HCL 4 MG PO TABS: 4.0000 mg | ORAL_TABLET | ORAL | Status: DC | PRN

## 2015-04-14 MED ORDER — MIDAZOLAM HCL 5 MG/5ML IJ SOLN
INTRAMUSCULAR | Status: DC | PRN
  Administered 2015-04-14 (×2): 1 mg via INTRAVENOUS

## 2015-04-14 MED ORDER — IBUPROFEN 600 MG PO TABS
600.0000 mg | ORAL_TABLET | Freq: Four times a day (QID) | ORAL | Status: DC
  Administered 2015-04-14 – 2015-04-15 (×4): 600 mg via ORAL
  Filled 2015-04-14 (×3): qty 1

## 2015-04-14 MED ORDER — DIPHENHYDRAMINE HCL 50 MG/ML IJ SOLN: 12.5000 mg | INTRAMUSCULAR | Status: DC | PRN

## 2015-04-14 MED ORDER — FAMOTIDINE 20 MG PO TABS
20.0000 mg | ORAL_TABLET | Freq: Every day | ORAL | Status: DC
Start: 2015-04-14 — End: 2015-04-15
  Filled 2015-04-14: qty 1

## 2015-04-14 MED ORDER — LIDOCAINE-EPINEPHRINE 2 %-1:100000 IJ SOLN
INTRAMUSCULAR | Status: DC | PRN
  Administered 2015-04-14: 3 mL via INTRADERMAL
  Administered 2015-04-14 (×3): 5 mL via INTRADERMAL

## 2015-04-14 MED ORDER — BENZOCAINE-MENTHOL 20-0.5 % EX AERO
1.0000 "application " | INHALATION_SPRAY | CUTANEOUS | Status: DC | PRN
  Filled 2015-04-14: qty 56

## 2015-04-14 MED ORDER — SODIUM CHLORIDE 0.9 % IJ SOLN
3.0000 mL | Freq: Two times a day (BID) | INTRAMUSCULAR | Status: DC
  Administered 2015-04-14 (×2): 3 mL via INTRAVENOUS

## 2015-04-14 MED ORDER — SIMETHICONE 80 MG PO CHEW: 80.0000 mg | CHEWABLE_TABLET | ORAL | Status: DC | PRN

## 2015-04-14 MED ORDER — OXYCODONE-ACETAMINOPHEN 5-325 MG PO TABS
1.0000 | ORAL_TABLET | ORAL | Status: DC | PRN
  Administered 2015-04-14 – 2015-04-15 (×3): 1 via ORAL
  Filled 2015-04-14 (×3): qty 1

## 2015-04-14 MED ORDER — BUPIVACAINE HCL (PF) 0.25 % IJ SOLN
INTRAMUSCULAR | Status: DC | PRN
  Administered 2015-04-14: 3 mL

## 2015-04-14 MED ORDER — MIDAZOLAM HCL 2 MG/2ML IJ SOLN
INTRAMUSCULAR | Status: AC
  Filled 2015-04-14: qty 4

## 2015-04-14 MED ORDER — PRENATAL MULTIVITAMIN CH: 1.0000 | ORAL_TABLET | Freq: Every day | ORAL | Status: DC

## 2015-04-14 MED ORDER — SODIUM BICARBONATE 8.4 % IV SOLN
INTRAVENOUS | Status: AC
  Filled 2015-04-14: qty 50

## 2015-04-14 MED ORDER — FAMOTIDINE 20 MG PO TABS
40.0000 mg | ORAL_TABLET | Freq: Once | ORAL | Status: AC
  Administered 2015-04-14: 40 mg via ORAL
  Filled 2015-04-14: qty 2

## 2015-04-14 MED ORDER — SENNOSIDES-DOCUSATE SODIUM 8.6-50 MG PO TABS: 2.0000 | ORAL_TABLET | ORAL | Status: DC

## 2015-04-14 SURGICAL SUPPLY — 26 items
CHLORAPREP W/TINT 26ML (MISCELLANEOUS) ×2 IMPLANT
CLOTH BEACON ORANGE TIMEOUT ST (SAFETY) ×2 IMPLANT
CONTAINER PREFILL 10% NBF 15ML (MISCELLANEOUS) ×4 IMPLANT
DRSG OPSITE POSTOP 3X4 (GAUZE/BANDAGES/DRESSINGS) ×2 IMPLANT
ELECT REM PT RETURN 9FT ADLT (ELECTROSURGICAL) ×2
ELECTRODE REM PT RTRN 9FT ADLT (ELECTROSURGICAL) ×1 IMPLANT
GLOVE BIO SURGEON STRL SZ7 (GLOVE) ×2 IMPLANT
GLOVE BIOGEL PI IND STRL 7.0 (GLOVE) ×1 IMPLANT
GLOVE BIOGEL PI INDICATOR 7.0 (GLOVE) ×1
GOWN STRL REUS W/TWL LRG LVL3 (GOWN DISPOSABLE) ×4 IMPLANT
LIQUID BAND (GAUZE/BANDAGES/DRESSINGS) ×1 IMPLANT
NEEDLE HYPO 22GX1.5 SAFETY (NEEDLE) ×2 IMPLANT
NS IRRIG 1000ML POUR BTL (IV SOLUTION) ×2 IMPLANT
PACK ABDOMINAL MINOR (CUSTOM PROCEDURE TRAY) ×2 IMPLANT
PENCIL BUTTON HOLSTER BLD 10FT (ELECTRODE) ×2 IMPLANT
SPONGE LAP 4X18 X RAY DECT (DISPOSABLE) IMPLANT
STRIP CLOSURE SKIN 1/2X4 (GAUZE/BANDAGES/DRESSINGS) ×1 IMPLANT
STRIP CLOSURE SKIN 1/4X3 (GAUZE/BANDAGES/DRESSINGS) ×2 IMPLANT
SUT PLAIN 0 NONE (SUTURE) ×2 IMPLANT
SUT VIC AB 0 CT1 27 (SUTURE) ×2
SUT VIC AB 0 CT1 27XBRD ANBCTR (SUTURE) ×1 IMPLANT
SUT VICRYL 4-0 PS2 18IN ABS (SUTURE) ×2 IMPLANT
SYR CONTROL 10ML LL (SYRINGE) ×2 IMPLANT
TOWEL OR 17X24 6PK STRL BLUE (TOWEL DISPOSABLE) ×4 IMPLANT
TRAY FOLEY CATH SILVER 14FR (SET/KITS/TRAYS/PACK) ×2 IMPLANT
WATER STERILE IRR 1000ML POUR (IV SOLUTION) ×2 IMPLANT

## 2015-04-14 NOTE — Progress Notes (Signed)
Pt wrote down what she would like for dinner and I called to place dinner order. Taylor Ferguson

## 2015-04-14 NOTE — Consult Note (Deleted)
[facility]  CC: CODE APGAR  S: called to room for infant who was blue.  Mother reports through interpretor that "[employer]" was feeding at breast when when he began to cough and choke with breast milk coming out from nose.  LD nurse who responded first noted blue colored infant with milk in nose and placed on warmer and called APGAR code.  On my arrival, infant in no acute distress with pink core and acrocyanosis.  Pregnancy and delivery history reviewed (notable for Waardenburg syndrome) as well as event through interpretor with parents.    O:  HR 130-140, Sao02 92% alert, active, AFSOF, mmm, palate intact, nares small, lungs clear, referred upper airway coarseness, nl s1 s2 no murmur, nl PMI, lungs clear, no wob, abd soft with +bowel sounds, no HSM, female, FROMx4, +suck, grasp ,moro.  Upper airway suctioned with  8 French catheter through both nares and passed into belly with colostrum noted.  Tolerated well.  Post SaO2 100%.   A/P:  Term infant post obstructive event who appears appropriate and without issues/concerns warranting further monitoring or management in NICU at this time.  Parents updated.  Continue routine NBN care and support breast feeding.  Please do not hesitate to contact us if further concerns.  Jerlyn Ly, MD  Neonatology

## 2015-04-14 NOTE — Anesthesia Postprocedure Evaluation (Signed)
  Anesthesia Post-op Note  Patient: Taylor Ferguson  Procedure(s) Performed: * No procedures listed *  Patient Location: Mother/Baby  Anesthesia Type:Epidural  Level of Consciousness: awake, alert  and oriented  Airway and Oxygen Therapy: Patient Spontanous Breathing  Post-op Pain: none  Post-op Assessment: Post-op Vital signs reviewed and Patient's Cardiovascular Status Stable              Post-op Vital Signs: Reviewed and stable  Last Vitals:  Filed Vitals:   04/14/15 0513  BP: 117/78  Pulse: 84  Temp: 37.2 C  Resp: 18    Complications: No apparent anesthesia complications

## 2015-04-14 NOTE — Anesthesia Postprocedure Evaluation (Signed)
  Anesthesia Post-op Note  Patient: Taylor Ferguson  Procedure(s) Performed: Procedure(s) (LRB): POST PARTUM TUBAL LIGATION (Bilateral)  Patient Location: PACU  Anesthesia Type: Epidural  Level of Consciousness: awake and alert   Airway and Oxygen Therapy: Patient Spontanous Breathing  Post-op Pain: mild  Post-op Assessment: Post-op Vital signs reviewed, Patient's Cardiovascular Status Stable, Respiratory Function Stable, Patent Airway and No signs of Nausea or vomiting  Last Vitals:  Filed Vitals:   04/14/15 1245  BP: 97/54  Pulse: 90  Temp: 36.6 C  Resp: 15    Post-op Vital Signs: stable   Complications: No apparent anesthesia complications

## 2015-04-14 NOTE — Anesthesia Preprocedure Evaluation (Signed)
Anesthesia Evaluation  Patient identified by MRN, date of birth, ID band Patient awake    Reviewed: Allergy & Precautions, NPO status , Patient's Chart, lab work & pertinent test results  History of Anesthesia Complications Negative for: history of anesthetic complications  Airway Mallampati: II  TM Distance: >3 FB Neck ROM: Full    Dental no notable dental hx. (+) Dental Advisory Given   Pulmonary neg pulmonary ROS,    Pulmonary exam normal breath sounds clear to auscultation       Cardiovascular negative cardio ROS Normal cardiovascular exam+ Valvular Problems/Murmurs  Rhythm:Regular Rate:Normal     Neuro/Psych PSYCHIATRIC DISORDERS Anxiety Depression negative neurological ROS     GI/Hepatic negative GI ROS, Neg liver ROS,   Endo/Other  negative endocrine ROS  Renal/GU negative Renal ROS  negative genitourinary   Musculoskeletal negative musculoskeletal ROS (+)   Abdominal   Peds negative pediatric ROS (+)  Hematology negative hematology ROS (+)   Anesthesia Other Findings   Reproductive/Obstetrics Postpartum <24 hours                             Anesthesia Physical Anesthesia Plan  ASA: II  Anesthesia Plan: Epidural   Post-op Pain Management:    Induction: Intravenous  Airway Management Planned:   Additional Equipment:   Intra-op Plan:   Post-operative Plan:   Informed Consent: I have reviewed the patients History and Physical, chart, labs and discussed the procedure including the risks, benefits and alternatives for the proposed anesthesia with the patient or authorized representative who has indicated his/her understanding and acceptance.   Dental advisory given  Plan Discussed with: CRNA  Anesthesia Plan Comments:         Anesthesia Quick Evaluation

## 2015-04-14 NOTE — Progress Notes (Signed)
At 0200 baby started breast feeding a latch score of 9  and was done at [facility].  Baby had fluid coming out of the nose and patient wipe it off. Baby was pink and warm and RN got baby's weight whiles mother eat. Baby breast feed the second time with no complications for 5 mins. At 240 baby latched for the 3rd time and RN  Educated patient about feeding cues and what to look for. Whiles getting a wheel chair patient called for help and  Glenice Bow went into the room . She saw the baby purple and Code Apgar was called .

## 2015-04-14 NOTE — Anesthesia Preprocedure Evaluation (Signed)
Anesthesia Evaluation  Patient identified by MRN, date of birth, ID band Patient awake    Reviewed: Allergy & Precautions, H&P , NPO status , Patient's Chart, lab work & pertinent test results  Airway Mallampati: I  TM Distance: >3 FB Neck ROM: full    Dental no notable dental hx.    Pulmonary neg pulmonary ROS,    Pulmonary exam normal        Cardiovascular negative cardio ROS Normal cardiovascular exam     Neuro/Psych negative neurological ROS     GI/Hepatic negative GI ROS, Neg liver ROS,   Endo/Other  negative endocrine ROS  Renal/GU negative Renal ROS     Musculoskeletal   Abdominal Normal abdominal exam  (+)   Peds  Hematology   Anesthesia Other Findings   Reproductive/Obstetrics (+) Pregnancy                             Anesthesia Physical Anesthesia Plan  ASA: II  Anesthesia Plan: Epidural   Post-op Pain Management:    Induction:   Airway Management Planned:   Additional Equipment:   Intra-op Plan:   Post-operative Plan:   Informed Consent: I have reviewed the patients History and Physical, chart, labs and discussed the procedure including the risks, benefits and alternatives for the proposed anesthesia with the patient or authorized representative who has indicated his/her understanding and acceptance.     Plan Discussed with:   Anesthesia Plan Comments:         Anesthesia Quick Evaluation

## 2015-04-14 NOTE — Op Note (Signed)
Preoperative diagnosis: Multiparity, permanent sterilization desired Postoperative diagnosis: Same Procedure: Bilateral tubal sterilization by Modified Pomeroy's Method Surgeon: Dr Azucena Fallen, MD Assistants: None Anesthesia: Epidural IV fluids: LR 700 cc  EBL: minimal Urine: 75 cc  Complications: none Disposition: PACU and home Specimens: Cut segments of both Fallopian tubes  Procedure Patient is 34 yo, [id number]. who desired permanent sterilization via tubal ligation. All options of contraception and sterilization were reviewed including IUDs, Essure, as well as vasectomy. Patient declined other options. Risk and complications of surgery including infection, bleeding, damage to internal organs, other complications including pneumonia, VTE were reviewed. Also discussed irreversibility as well as failure and risk of ectopic pregnancy. Patient voiced understanding. Informed written consent was obtained and patient was brought to the operating room with IV running. Timeout was carried out. She underwent bolus in her epidural  anesthesia without difficulty and was given dorsal supine position. Abdomen was prepped and draped in standard fashion.  Marcaine injected 1 cm below the umbilicus and a trasverse incision about 1.5 cm was made under the umbilicus. Incision was carried down the fascia and fascia was grasped with Kocker clamps. Fascia was incised. Posterior rectus sheath and peritoneum grasped and incised and peritoneal entry was confirmed. Uterus fundus was noted and incision was pulled to the left, left tube was identified and picked up with Babcock's and traced to fimbria. Then a mid portion of the left tube was grasped, a loop was created and tied with 0 Plain gut x 2 and a segment was cut. Cut segments were cauterized and were hemostatic. Tied portion was send back in the peritoneal cavity with excellent hemostasis.  Now the incision was pulled to the right of the fundus and right tube was  identified, grasped and traced to Fimbria and similar procedure performed on the right tube, bilateral Modified Pomeroy's method. Both cut segments sent to pathology.  Posterior rectus sheath and fascia grasped and sutured with running suture of 0-vicryl. Skin closed in subcuticular fashion with 4-0 vicryl. Dermabond was applied at the incision. All counts were correct x2. Patient was brought to the PACU in stable condition.   V.Nadeem Romanoski,MD

## 2015-04-14 NOTE — Transfer of Care (Signed)
Immediate Anesthesia Transfer of Care Note  Patient: Taylor Ferguson  Procedure(s) Performed: Procedure(s): POST PARTUM TUBAL LIGATION (Bilateral)  Patient Location: PACU  Anesthesia Type:Epidural  Level of Consciousness: awake, alert  and oriented  Airway & Oxygen Therapy: Patient Spontanous Breathing  Post-op Assessment: Report given to RN and Post -op Vital signs reviewed and stable  Post vital signs: Reviewed and stable  Last Vitals:  Filed Vitals:   04/14/15 1151  BP: 114/73  Pulse: 75  Temp: 36.7 C  Resp: 18    Complications: No apparent anesthesia complications

## 2015-04-14 NOTE — Lactation Note (Addendum)
This note was copied from the chart of [facility]. Lactation Consultation Note  Patient Name: [name] [id number] Date: 04/14/2015 Reason for consult: Initial assessment Mom is hearing impaired but reads lips well. Mom was able to communicate and respond appropriately to questions. Mom reports she thinks this baby is nursing well, she denies questions or concerns. Some basic teaching reviewed. Discussed some early term baby behaviors and that Mom may need to wake baby to BF and stimulate to keep awake at the breast. Lactation brochure left for review, advised of OP services and support group. Encouraged to call for assist as needed.   Maternal Data Has patient been taught Hand Expression?: No (Mom declined, reports she knows how to hand express) Does the patient have breastfeeding experience prior to this delivery?: Yes  Feeding Feeding Type: Breast Fed Length of feed: 13 min  LATCH Score/Interventions                      Lactation Tools Discussed/Used WIC Program: No   Consult Status Consult Status: Follow-up Date: 04/15/15 Follow-up type: In-patient    Katrine Coho 04/14/2015, 11:00 PM

## 2015-04-14 NOTE — Progress Notes (Signed)
Patient ID: Taylor Ferguson, female   DOB: 01-30-1981, 34 y.o.   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 H/o palpitations in last pregnancy, none this time. Uncomplicated pregnancy and SVD. Desires permanent sterilization with Tubal ligation.  Exam: BP 114/73 mmHg  Pulse 75  Temp(Src) 98 F (36.7 C) (Oral)  Resp 18  Ht 5\' 6"  (1.676 m)  Wt 168 lb (76.204 kg)  BMI 27.13 kg/m2  SpO2 99%  Breastfeeding? Unknown  Uterus at umbilicus.  Lab- mild anemia.   30Day Medicaid consent for TL signed on 02/04/15. Reviewed procedure, risks/ complications including failure, risk of ectopic pregnancy etc. Also risks/complications of surgery reviewed incl infection, bleeding, damage to internal organs including bladder, bowels, ureters, blood vessels, other risks from anesthesia, VTE and delayed complications of any surgery, complications in future surgery reviewed.  V.Mody, MD

## 2015-04-14 NOTE — Anesthesia Procedure Notes (Signed)
Epidural Patient location during procedure: OB Start time: 04/14/2015 12:42 AM End time: 04/14/2015 12:46 AM  Staffing Anesthesiologist: Lyn Hollingshead Performed by: anesthesiologist   Preanesthetic Checklist Completed: patient identified, surgical consent, pre-op evaluation, timeout performed, IV checked, risks and benefits discussed and monitors and equipment checked  Epidural Patient position: sitting Prep: site prepped and draped and DuraPrep Patient monitoring: continuous pulse ox and blood pressure Approach: midline Location: L3-L4 Injection technique: LOR air  Needle:  Needle type: Tuohy  Needle gauge: 17 G Needle length: 9 cm and 9 Needle insertion depth: 5 cm cm Catheter type: closed end flexible Catheter size: 19 Gauge Catheter at skin depth: 10 cm Test dose: negative and Other  Assessment Sensory level: T9 Events: blood not aspirated, injection not painful, no injection resistance, negative IV test and no paresthesia  Additional Notes Reason for block:procedure for pain

## 2015-04-15 ENCOUNTER — Encounter (HOSPITAL_COMMUNITY): Payer: Self-pay | Admitting: Obstetrics and Gynecology

## 2015-04-15 DIAGNOSIS — H9190 Unspecified hearing loss, unspecified ear: Secondary | ICD-10-CM | POA: Diagnosis not present

## 2015-04-15 DIAGNOSIS — O4292 Full-term premature rupture of membranes, unspecified as to length of time between rupture and onset of labor: Secondary | ICD-10-CM | POA: Diagnosis not present

## 2015-04-15 DIAGNOSIS — F329 Major depressive disorder, single episode, unspecified: Secondary | ICD-10-CM | POA: Diagnosis not present

## 2015-04-15 DIAGNOSIS — O99344 Other mental disorders complicating childbirth: Secondary | ICD-10-CM | POA: Diagnosis not present

## 2015-04-15 DIAGNOSIS — R011 Cardiac murmur, unspecified: Secondary | ICD-10-CM | POA: Diagnosis not present

## 2015-04-15 DIAGNOSIS — R002 Palpitations: Secondary | ICD-10-CM | POA: Diagnosis not present

## 2015-04-15 DIAGNOSIS — Z3A37 37 weeks gestation of pregnancy: Secondary | ICD-10-CM | POA: Diagnosis not present

## 2015-04-15 DIAGNOSIS — Z9851 Tubal ligation status: Secondary | ICD-10-CM

## 2015-04-15 DIAGNOSIS — Z302 Encounter for sterilization: Secondary | ICD-10-CM | POA: Diagnosis not present

## 2015-04-15 HISTORY — DX: Tubal ligation status: Z98.51

## 2015-04-15 MED ORDER — POLYSACCHARIDE IRON COMPLEX 150 MG PO CAPS
150.0000 mg | ORAL_CAPSULE | Freq: Two times a day (BID) | ORAL | Status: DC
  Administered 2015-04-15: 150 mg via ORAL
  Filled 2015-04-15: qty 1

## 2015-04-15 MED ORDER — MAGNESIUM OXIDE 400 (241.3 MG) MG PO TABS: 200.0000 mg | ORAL_TABLET | Freq: Every day | ORAL | Status: DC

## 2015-04-15 MED ORDER — MAGNESIUM OXIDE 400 (241.3 MG) MG PO TABS
200.0000 mg | ORAL_TABLET | Freq: Every day | ORAL | Status: DC
  Administered 2015-04-15: 200 mg via ORAL
  Filled 2015-04-15: qty 0.5

## 2015-04-15 MED ORDER — POLYSACCHARIDE IRON COMPLEX 150 MG PO CAPS: 150.0000 mg | ORAL_CAPSULE | Freq: Two times a day (BID) | ORAL | Status: DC

## 2015-04-15 MED ORDER — IBUPROFEN 600 MG PO TABS: 600.0000 mg | ORAL_TABLET | Freq: Four times a day (QID) | ORAL | Status: DC

## 2015-04-15 NOTE — Progress Notes (Signed)
Patient was referred for history of depression/anxiety.  * Referral screened out by Clinical Social Worker because none of the following criteria appear to apply:  ~ History of anxiety/depression during this pregnancy, or of post-partum depression.  ~ Diagnosis of anxiety and/or depression within last 3 years  ~ History of depression due to pregnancy loss/loss of child  OR  * Patient's symptoms currently being treated with medication and/or therapy.  Please contact the Clinical Social Worker if needs arise, or by the patient's request.  CSW notes documentation from encounter with CSW 2 years ago: Pt denies depression history. Depression hx is not noted in pt's chart.

## 2015-04-15 NOTE — Discharge Instructions (Signed)
Breast Pumping Tips °If you are breastfeeding, there may be times when you cannot feed your baby directly. Returning to work or going on a trip are common examples. Pumping allows you to store breast milk and feed it to your baby later.  °You may not get much milk when you first start to pump. Your breasts should start to make more after a few days. If you pump at the times you usually feed your baby, you may be able to keep making enough milk to feed your baby without also using formula. The more often you pump, the more milk you will produce.  °WHEN SHOULD I PUMP?  °· You can begin to pump soon after delivery. However, some experts recommend waiting about 4 weeks before giving your infant a bottle to make sure breastfeeding is going well.  °· If you plan to return to work, begin pumping a few weeks before. This will help you develop techniques that work best for you. It also lets you build up a supply of breast milk.   °· When you are with your infant, feed on demand and pump after each feeding.   °· When you are away from your infant for several hours, pump for about 15 minutes every 2-3 hours. Pump both breasts at the same time if you can.   °· If your infant has a formula feeding, make sure to pump around the same time.     °· If you drink any alcohol, wait 2 hours before pumping.   °HOW DO I PREPARE TO PUMP? °Your let-down reflex is the natural reaction to stimulation that makes your breast milk flow. It is easier to stimulate this reflex when you are relaxed. Find relaxation techniques that work for you. If you have difficulty with your let-down reflex, try these methods:  °· Smell one of your infant's blankets or an item of clothing.   °· Look at a picture or video of your infant.   °· Sit in a quiet, private space.   °· Massage the breast you plan to pump.   °· Place soothing warmth on the breast.   °· Play relaxing music.   °WHAT ARE SOME GENERAL BREAST PUMPING TIPS? °· Wash your hands before you pump. You  do not need to wash your nipples or breasts. °· There are three ways to pump. °¨ You can use your hand to massage and compress your breast. °¨ You can use a handheld manual pump. °¨ You can use an electric pump.   °· Make sure the suction cup (flange) on the breast pump is the right size. Place the flange directly over the nipple. If it is the wrong size or placed the wrong way, it may be painful and cause nipple damage.   °· If pumping is uncomfortable, apply a small amount of purified or modified lanolin to your nipple and areola. °· If you are using an electric pump, adjust the speed and suction power to be more comfortable. °· If pumping is painful or if you are not getting very much milk, you may need a different type of pump. A lactation consultant can help you determine what type of pump to use.   °· Keep a full water bottle near you at all times. Drinking lots of fluid helps you make more milk.  °· You can store your milk to use later. Pumped breast milk can be stored in a sealable, sterile container or plastic bag. Label all stored breast milk with the date you pumped it. °¨ Milk can stay out at room temperature for up to 8 hours. °¨   You can store your milk in the refrigerator for up to 8 days. °¨ You can store your milk in the freezer for 3 months. Thaw frozen milk using warm water. Do not put it in the microwave. °· Do not smoke. Smoking can lower your milk supply and harm your infant. If you need help quitting, ask your health care provider to recommend a program.   °WHEN SHOULD I CALL MY HEALTH CARE PROVIDER OR A LACTATION CONSULTANT? °· You are having trouble pumping. °· You are concerned that you are not making enough milk. °· You have nipple pain, soreness, or redness. °· You want to use birth control. Birth control pills may lower your milk supply. Talk to your health care provider about your options. °Document Released: 12/31/2009 Document Revised: 07/18/2013 Document Reviewed:  05/05/2013 °Exit[employer]® Patient Information ©2015 [employer]. This information is not intended to replace advice given to you by your health care provider. Make sure you discuss any questions you have with your health care provider. ° °Nutrition for the New Mother  °A new mother needs good health and nutrition so she can have energy to take care of a new baby. Whether a mother breastfeeds or formula feeds the baby, it is important to have a well-balanced diet. Foods from all the food groups should be chosen to meet the new mother's energy needs and to give her the nutrients needed for repair and healing.  °A HEALTHY EATING PLAN °The My [employer] plan for Moms outlines what you should eat to help you and your baby stay healthy. The energy and amount of food you need depends on whether or not you are breastfeeding. If you are breastfeeding you will need more nutrients. If you choose not to breastfeed, your nutrition goal should be to return to a healthy weight. Limiting calories may be needed if you are not breastfeeding.  °HOME CARE INSTRUCTIONS  °· For a personal plan based on your unique needs, see your Registered Dietitian or visit www.mypyramid.gov. °· Eat a variety of foods. The plan below will help guide you. The following chart has a suggested daily meal plan from the [employer] for Moms. °· Eat a variety of fruits and vegetables. °· Eat more dark green and orange vegetables and cooked dried beans. °· Make half your grains whole grains. Choose whole instead of refined grains. °· Choose low-fat or lean meats and poultry. °· Choose low-fat or fat-free dairy products like milk, cheese, or yogurt. °Fruits °· Breastfeeding: 2 cups °· Non-Breastfeeding: 2 cups °· What Counts as a serving? °¨ 1 cup of fruit or juice. °¨ ½ cup dried fruit. °Vegetables °· Breastfeeding: 3 cups °· Non-Breastfeeding: 2 ½ cups °· What Counts as a serving? °¨ 1 cup raw or cooked vegetables. °¨ Juice or 2 cups raw leafy  vegetables. °Grains °· Breastfeeding: 8 oz °· Non-Breastfeeding: 6 oz °· What Counts as a serving? °¨ 1 slice bread. °¨ 1 oz ready-to-eat cereal. °¨ ½ cup cooked pasta, rice, or cereal. °Meat and Beans °· Breastfeeding: 6 ½ oz °· Non-Breastfeeding: 5 ½ oz °· What Counts as a serving? °¨ 1 oz lean meat, poultry, or fish °¨ ¼ cup cooked dry beans °¨ ½ oz nuts or 1 egg °¨ 1 tbs peanut butter °Milk °· Breastfeeding: 3 cups °· Non-Breastfeeding: 3 cups °· What Counts as a serving? °¨ 1 cup milk. °¨ 8 oz yogurt. °¨ 1 ½ oz cheese. °¨ 2 oz processed cheese. °TIPS FOR THE BREASTFEEDING MOM °· Rapid weight   loss is not suggested when you are breastfeeding. By simply breastfeeding, you will be able to lose the weight gained during your pregnancy. Your caregiver can keep track of your weight and tell you if your weight loss is appropriate. °· Be sure to drink fluids. You may notice that you are thirstier than usual. A suggestion is to drink a glass of water or other beverage whenever you breastfeed. °· Avoid alcohol as it can be passed into your breast milk. °· Limit caffeine drinks to no more than 2 to 3 cups per day. °· You may need to keep taking your prenatal vitamin while you are breastfeeding. Talk with your caregiver about taking a vitamin or supplement. °RETURING TO A HEALTHY WEIGHT °· The My [employer] Plan for Moms will help you return to a healthy weight. It will also provide the nutrients you need. °· You may need to limit "empty" calories. These include: °¨ High fat foods like fried foods, fatty meats, fast food, butter, and mayonnaise. °¨ High sugar foods like sodas, jelly, candy, and sweets. °· Be physically active. Include 30 minutes of exercise or more each day. Choose an activity you like such as walking, swimming, biking, or aerobics. Check with your caregiver before you start to exercise. °Document Released: 10/20/2007 Document Revised: 10/05/2011 Document Reviewed: 10/20/2007 °ExitCare® Patient Information  ©2015 [employer]. This information is not intended to replace advice given to you by your health care provider. Make sure you discuss any questions you have with your health care provider. °Postpartum Depression and Baby Blues °The postpartum period begins right after the birth of a baby. During this time, there is often a great amount of joy and excitement. It is also a time of many changes in the life of the parents. Regardless of how many times a mother gives birth, each child brings new challenges and dynamics to the family. It is not unusual to have feelings of excitement along with confusing shifts in moods, emotions, and thoughts. All mothers are at risk of developing postpartum depression or the "baby blues." These mood changes can occur right after giving birth, or they may occur many months after giving birth. The baby blues or postpartum depression can be mild or severe. Additionally, postpartum depression can go away rather quickly, or it can be a long-term condition.  °CAUSES °Raised hormone levels and the rapid drop in those levels are thought to be a main cause of postpartum depression and the baby blues. A number of hormones change during and after pregnancy. Estrogen and progesterone usually decrease right after the delivery of your baby. The levels of thyroid hormone and various cortisol steroids also rapidly drop. Other factors that play a role in these mood changes include major life events and genetics.  °RISK FACTORS °If you have any of the following risks for the baby blues or postpartum depression, know what symptoms to watch out for during the postpartum period. Risk factors that may increase the likelihood of getting the baby blues or postpartum depression include: °· Having a personal or family history of depression.   °· Having depression while being pregnant.   °· Having premenstrual mood issues or mood issues related to oral contraceptives. °· Having a lot of life stress.   °· Having  marital conflict.   °· Lacking a social support network.   °· Having a baby with special needs.   °· Having health problems, such as diabetes.   °SIGNS AND SYMPTOMS °Symptoms of baby blues include: °· Brief changes in mood, such as going   from extreme happiness to sadness. °· Decreased concentration.   °· Difficulty sleeping.   °· Crying spells, tearfulness.   °· Irritability.   °· Anxiety.   °Symptoms of postpartum depression typically begin within the first month after giving birth. These symptoms include: °· Difficulty sleeping or excessive sleepiness.   °· Marked weight loss.   °· Agitation.   °· Feelings of worthlessness.   °· Lack of interest in activity or food.   °Postpartum psychosis is a very serious condition and can be dangerous. Fortunately, it is rare. Displaying any of the following symptoms is cause for immediate medical attention. Symptoms of postpartum psychosis include:  °· Hallucinations and delusions.   °· Bizarre or disorganized behavior.   °· Confusion or disorientation.   °DIAGNOSIS  °A diagnosis is made by an evaluation of your symptoms. There are no medical or lab tests that lead to a diagnosis, but there are various questionnaires that a health care provider may use to identify those with the baby blues, postpartum depression, or psychosis. Often, a screening tool called the Edinburgh Postnatal Depression Scale is used to diagnose depression in the postpartum period.  °TREATMENT °The baby blues usually goes away on its own in 1-2 weeks. Social support is often all that is needed. You will be encouraged to get adequate sleep and rest. Occasionally, you may be given medicines to help you sleep.  °Postpartum depression requires treatment because it can last several months or longer if it is not treated. Treatment may include individual or group therapy, medicine, or both to address any social, physiological, and psychological factors that may play a role in the depression. Regular exercise, a  healthy diet, rest, and social support may also be strongly recommended.  °Postpartum psychosis is more serious and needs treatment right away. Hospitalization is often needed. °HOME CARE INSTRUCTIONS °· Get as much rest as you can. Nap when the baby sleeps.   °· Exercise regularly. Some women find yoga and walking to be beneficial.   °· Eat a balanced and nourishing diet.   °· Do little things that you enjoy. Have a cup of tea, take a bubble bath, read your favorite magazine, or listen to your favorite music. °· Avoid alcohol.   °· Ask for help with household chores, cooking, grocery shopping, or running errands as needed. Do not try to do everything.   °· Talk to people close to you about how you are feeling. Get support from your partner, family members, friends, or other new moms. °· Try to stay positive in how you think. Think about the things you are grateful for.   °· Do not spend a lot of time alone.   °· Only take over-the-counter or prescription medicine as directed by your health care provider. °· Keep all your postpartum appointments.   °· Let your health care provider know if you have any concerns.   °SEEK MEDICAL CARE IF: °You are having a reaction to or problems with your medicine. °SEEK IMMEDIATE MEDICAL CARE IF: °· You have suicidal feelings.   °· You think you may harm the baby or someone else. °MAKE SURE YOU: °· Understand these instructions. °· Will watch your condition. °· Will get help right away if you are not doing well or get worse. °Document Released: 04/16/2004 Document Revised: 07/18/2013 Document Reviewed: 04/24/2013 °Exit[employer]® Patient Information ©2015 [employer]. This information is not intended to replace advice given to you by your health care provider. Make sure you discuss any questions you have with your health care provider. °Postpartum Care After Vaginal Delivery °After you deliver your newborn (postpartum period), the usual stay in   the hospital is 24-72 hours. If there were  problems with your labor or delivery, or if you have other medical problems, you might be in the hospital longer.  °While you are in the hospital, you will receive help and instructions on how to care for yourself and your newborn during the postpartum period.  °While you are in the hospital: °· Be sure to tell your nurses if you have pain or discomfort, as well as where you feel the pain and what makes the pain worse. °· If you had an incision made near your vagina (episiotomy) or if you had some tearing during delivery, the nurses may put ice packs on your episiotomy or tear. The ice packs may help to reduce the pain and swelling. °· If you are breastfeeding, you may feel uncomfortable contractions of your uterus for a couple of weeks. This is normal. The contractions help your uterus get back to normal size. °· It is normal to have some bleeding after delivery. °· For the first 1-3 days after delivery, the flow is red and the amount may be similar to a period. °· It is common for the flow to start and stop. °· In the first few days, you may pass some small clots. Let your nurses know if you begin to pass large clots or your flow increases. °· Do not  flush blood clots down the toilet before having the nurse look at them. °· During the next 3-10 days after delivery, your flow should become more watery and pink or brown-tinged in color. °· Ten to fourteen days after delivery, your flow should be a small amount of yellowish-white discharge. °· The amount of your flow will decrease over the first few weeks after delivery. Your flow may stop in 6-8 weeks. Most women have had their flow stop by 12 weeks after delivery. °· You should change your sanitary pads frequently. °· Wash your hands thoroughly with soap and water for at least 20 seconds after changing pads, using the toilet, or before holding or feeding your newborn. °· You should feel like you need to empty your bladder within the first 6-8 hours after  delivery. °· In case you become weak, lightheaded, or faint, call your nurse before you get out of bed for the first time and before you take a shower for the first time. °· Within the first few days after delivery, your breasts may begin to feel tender and full. This is called engorgement. Breast tenderness usually goes away within 48-72 hours after engorgement occurs. You may also notice milk leaking from your breasts. If you are not breastfeeding, do not stimulate your breasts. Breast stimulation can make your breasts produce more milk. °· Spending as much time as possible with your newborn is very important. During this time, you and your newborn can feel close and get to know each other. Having your newborn stay in your room (rooming in) will help to strengthen the bond with your newborn.  It will give you time to get to know your newborn and become comfortable caring for your newborn. °· Your hormones change after delivery. Sometimes the hormone changes can temporarily cause you to feel sad or tearful. These feelings should not last more than a few days. If these feelings last longer than that, you should talk to your caregiver. °· If desired, talk to your caregiver about methods of family planning or contraception. °· Talk to your caregiver about immunizations. Your caregiver may want you to have the   following immunizations before leaving the hospital: °· Tetanus, diphtheria, and pertussis (Tdap) or tetanus and diphtheria (Td) immunization. It is very important that you and your family (including grandparents) or others caring for your newborn are up-to-date with the Tdap or Td immunizations. The Tdap or Td immunization can help protect your newborn from getting ill. °· Rubella immunization. °· Varicella (chickenpox) immunization. °· Influenza immunization. You should receive this annual immunization if you did not receive the immunization during your pregnancy. °Document Released: 05/10/2007 Document  Revised: 04/06/2012 Document Reviewed: 03/09/2012 °[employer]® Patient Information ©2015 [employer]. This information is not intended to replace advice given to you by your health care provider. Make sure you discuss any questions you have with your health care provider. °Breastfeeding and Mastitis °Mastitis is inflammation of the breast tissue. It can occur in women who are breastfeeding. This can make breastfeeding painful. Mastitis will sometimes go away on its own. Your health care provider will help determine if treatment is needed. °CAUSES °Mastitis is often associated with a blocked milk (lactiferous) duct. This can happen when too much milk builds up in the breast. Causes of excess milk in the breast can include: °· Poor latch-on. If your baby is not latched onto the breast properly, she or he may not empty your breast completely while breastfeeding. °· Allowing too much time to pass between feedings. °· Wearing a bra or other clothing that is too tight. This puts extra pressure on the lactiferous ducts so milk does not flow through them as it should. °Mastitis can also be caused by a bacterial infection. Bacteria may enter the breast tissue through cuts or openings in the skin. In women who are breastfeeding, this may occur because of cracked or irritated skin. Cracks in the skin are often caused when your baby does not latch on properly to the breast. °SIGNS AND SYMPTOMS °· Swelling, redness, tenderness, and pain in an area of the breast. °· Swelling of the glands under the arm on the same side. °· Fever may or may not accompany mastitis. °If an infection is allowed to progress, a collection of pus (abscess) may develop. °DIAGNOSIS  °Your health care provider can usually diagnose mastitis based on your symptoms and a physical exam. Tests may be done to help confirm the diagnosis. These may include: °· Removal of pus from the breast by applying pressure to the area. This pus can be examined in the lab to  determine which bacteria are present. If an abscess has developed, the fluid in the abscess can be removed with a needle. This can also be used to confirm the diagnosis and determine the bacteria present. In most cases, pus will not be present. °· Blood tests to determine if your body is fighting a bacterial infection. °· Mammogram or ultrasound tests to rule out other problems or diseases. °TREATMENT  °Mastitis that occurs with breastfeeding will sometimes go away on its own. Your health care provider may choose to wait 24 hours after first seeing you to decide whether a prescription medicine is needed. If your symptoms are worse after 24 hours, your health care provider will likely prescribe an antibiotic medicine to treat the mastitis. He or she will determine which bacteria are most likely causing the infection and will then select an appropriate antibiotic medicine. This is sometimes changed based on the results of tests performed to identify the bacteria, or if there is no response to the antibiotic medicine selected. Antibiotic medicines are usually given by mouth. You   may also be given medicine for pain. °HOME CARE INSTRUCTIONS °· Only take over-the-counter or prescription medicines for pain, fever, or discomfort as directed by your health care provider. °· If your health care provider prescribed an antibiotic medicine, take the medicine as directed. Make sure you finish it even if you start to feel better. °· Do not wear a tight or underwire bra. Wear a soft, supportive bra. °· Increase your fluid intake, especially if you have a fever. °· Continue to empty the breast. Your health care provider can tell you whether this milk is safe for your infant or needs to be thrown out. You may be told to stop nursing until your health care provider thinks it is safe for your baby. Use a breast pump if you are advised to stop nursing. °· Keep your nipples clean and dry. °· Empty the first breast completely before going  to the other breast. If your baby is not emptying your breasts completely for some reason, use a breast pump to empty your breasts. °· If you go back to work, pump your breasts while at work to stay in time with your nursing schedule. °· Avoid allowing your breasts to become overly filled with milk (engorged). °SEEK MEDICAL CARE IF: °· You have pus-like discharge from the breast. °· Your symptoms do not improve with the treatment prescribed by your health care provider within 2 days. °SEEK IMMEDIATE MEDICAL CARE IF: °· Your pain and swelling are getting worse. °· You have pain that is not controlled with medicine. °· You have a red line extending from the breast toward your armpit. °· You have a fever or persistent symptoms for more than 2-3 days. °· You have a fever and your symptoms suddenly get worse. °MAKE SURE YOU:  °· Understand these instructions. °· Will watch your condition. °· Will get help right away if you are not doing well or get worse. °Document Released: 11/07/2004 Document Revised: 07/18/2013 Document Reviewed: 02/16/2013 °[employer]® Patient Information ©2015 [employer]. This information is not intended to replace advice given to you by your health care provider. Make sure you discuss any questions you have with your health care provider. °Breastfeeding °Deciding to breastfeed is one of the best choices you can make for you and your baby. A change in hormones during pregnancy causes your breast tissue to grow and increases the number and size of your milk ducts. These hormones also allow proteins, sugars, and fats from your blood supply to make breast milk in your milk-producing glands. Hormones prevent breast milk from being released before your baby is born as well as prompt milk flow after birth. Once breastfeeding has begun, thoughts of your baby, as well as his or her sucking or crying, can stimulate the release of milk from your milk-producing glands.  °BENEFITS OF BREASTFEEDING °For Your  Baby °· Your first milk (colostrum) helps your baby's digestive system function better.   °· There are antibodies in your milk that help your baby fight off infections.   °· Your baby has a lower incidence of asthma, allergies, and sudden infant death syndrome.   °· The nutrients in breast milk are better for your baby than infant formulas and are designed uniquely for your baby's needs.   °· Breast milk improves your baby's brain development.   °· Your baby is less likely to develop other conditions, such as childhood obesity, asthma, or type 2 diabetes mellitus.   °For You  °· Breastfeeding helps to create a very special bond between you and   your baby.   °· Breastfeeding is convenient. Breast milk is always available at the correct temperature and costs nothing.   °· Breastfeeding helps to burn calories and helps you lose the weight gained during pregnancy.   °· Breastfeeding makes your uterus contract to its prepregnancy size faster and slows bleeding (lochia) after you give birth.   °· Breastfeeding helps to lower your risk of developing type 2 diabetes mellitus, osteoporosis, and breast or ovarian cancer later in life. °SIGNS THAT YOUR BABY IS HUNGRY °Early Signs of Hunger  °· Increased alertness or activity. °· Stretching. °· Movement of the head from side to side. °· Movement of the head and opening of the mouth when the corner of the mouth or cheek is stroked (rooting). °· Increased sucking sounds, smacking lips, cooing, sighing, or squeaking. °· Hand-to-mouth movements. °· Increased sucking of fingers or hands. °Late Signs of Hunger °· Fussing. °· Intermittent crying. °Extreme Signs of Hunger °Signs of extreme hunger will require calming and consoling before your baby will be able to breastfeed successfully. Do not wait for the following signs of extreme hunger to occur before you initiate breastfeeding:   °· Restlessness. °· A loud, strong cry. °·  Screaming. °BREASTFEEDING BASICS °Breastfeeding  Initiation °· Find a comfortable place to sit or lie down, with your neck and back well supported. °· Place a pillow or rolled up blanket under your baby to bring him or her to the level of your breast (if you are seated). Nursing pillows are specially designed to help support your arms and your baby while you breastfeed. °· Make sure that your baby's abdomen is facing your abdomen.   °· Gently massage your breast. With your fingertips, massage from your chest wall toward your nipple in a circular motion. This encourages milk flow. You may need to continue this action during the feeding if your milk flows slowly. °· Support your breast with 4 fingers underneath and your thumb above your nipple. Make sure your fingers are well away from your nipple and your baby's mouth.   °· Stroke your baby's lips gently with your finger or nipple.   °· When your baby's mouth is open wide enough, quickly bring your baby to your breast, placing your entire nipple and as much of the colored area around your nipple (areola) as possible into your baby's mouth.   °¨ More areola should be visible above your baby's upper lip than below the lower lip.   °¨ Your baby's tongue should be between his or her lower gum and your breast.   °· Ensure that your baby's mouth is correctly positioned around your nipple (latched). Your baby's lips should create a seal on your breast and be turned out (everted). °· It is common for your baby to suck about 2-3 minutes in order to start the flow of breast milk. °Latching °Teaching your baby how to latch on to your breast properly is very important. An improper latch can cause nipple pain and decreased milk supply for you and poor weight gain in your baby. Also, if your baby is not latched onto your nipple properly, he or she may swallow some air during feeding. This can make your baby fussy. Burping your baby when you switch breasts during the feeding can help to get rid of the air. However, teaching your  baby to latch on properly is still the best way to prevent fussiness from swallowing air while breastfeeding. °Signs that your baby has successfully latched on to your nipple:    °· Silent tugging or silent sucking, without   causing you pain.   °· Swallowing heard between every 3-4 sucks.   °·  Muscle movement above and in front of his or her ears while sucking.   °Signs that your baby has not successfully latched on to nipple:  °· Sucking sounds or smacking sounds from your baby while breastfeeding. °· Nipple pain. °If you think your baby has not latched on correctly, slip your finger into the corner of your baby's mouth to break the suction and place it between your baby's gums. Attempt breastfeeding initiation again. °Signs of Successful Breastfeeding °Signs from your baby:   °· A gradual decrease in the number of sucks or complete cessation of sucking.   °· Falling asleep.   °· Relaxation of his or her body.   °· Retention of a small amount of milk in his or her mouth.   °· Letting go of your breast by himself or herself. °Signs from you: °· Breasts that have increased in firmness, weight, and size 1-3 hours after feeding.   °· Breasts that are softer immediately after breastfeeding. °· Increased milk volume, as well as a change in milk consistency and color by the fifth day of breastfeeding.   °· Nipples that are not sore, cracked, or bleeding. °Signs That Your Baby is Getting Enough Milk °· Wetting at least 3 diapers in a 24-hour period. The urine should be clear and pale yellow by age 5 days. °· At least 3 stools in a 24-hour period by age 5 days. The stool should be soft and yellow. °· At least 3 stools in a 24-hour period by age 7 days. The stool should be seedy and yellow. °· No loss of weight greater than 10% of birth weight during the first 3 days of age. °· Average weight gain of 4-7 ounces (113-198 g) per week after age 4 days. °· Consistent daily weight gain by age 5 days, without weight loss after the  age of 2 weeks. °After a feeding, your baby may spit up a small amount. This is common. °BREASTFEEDING FREQUENCY AND DURATION °Frequent feeding will help you make more milk and can prevent sore nipples and breast engorgement. Breastfeed when you feel the need to reduce the fullness of your breasts or when your baby shows signs of hunger. This is called "breastfeeding on demand." Avoid introducing a pacifier to your baby while you are working to establish breastfeeding (the first 4-6 weeks after your baby is born). After this time you may choose to use a pacifier. Research has shown that pacifier use during the first year of a baby's life decreases the risk of sudden infant death syndrome (SIDS). °Allow your baby to feed on each breast as long as he or she wants. Breastfeed until your baby is finished feeding. When your baby unlatches or falls asleep while feeding from the first breast, offer the second breast. Because newborns are often sleepy in the first few weeks of life, you may need to awaken your baby to get him or her to feed. °Breastfeeding times will vary from baby to baby. However, the following rules can serve as a guide to help you ensure that your baby is properly fed: °· Newborns (babies 4 weeks of age or younger) may breastfeed every 1-3 hours. °· Newborns should not go longer than 3 hours during the day or 5 hours during the night without breastfeeding. °· You should breastfeed your baby a minimum of 8 times in a 24-hour period until you begin to introduce solid foods to your baby at around 6 months of   age. °BREAST MILK PUMPING °Pumping and storing breast milk allows you to ensure that your baby is exclusively fed your breast milk, even at times when you are unable to breastfeed. This is especially important if you are going back to work while you are still breastfeeding or when you are not able to be present during feedings. Your lactation consultant can give you guidelines on how long it is safe to  store breast milk.  °A breast pump is a machine that allows you to pump milk from your breast into a sterile bottle. The pumped breast milk can then be stored in a refrigerator or freezer. Some breast pumps are operated by hand, while others use electricity. Ask your lactation consultant which type will work best for you. Breast pumps can be purchased, but some hospitals and breastfeeding support groups lease breast pumps on a monthly basis. A lactation consultant can teach you how to hand express breast milk, if you prefer not to use a pump.  °CARING FOR YOUR BREASTS WHILE YOU BREASTFEED °Nipples can become dry, cracked, and sore while breastfeeding. The following recommendations can help keep your breasts moisturized and healthy: °· Avoid using soap on your nipples.   °· Wear a supportive bra. Although not required, special nursing bras and tank tops are designed to allow access to your breasts for breastfeeding without taking off your entire bra or top. Avoid wearing underwire-style bras or extremely tight bras. °· Air dry your nipples for 3-4 minutes after each feeding.   °· Use only cotton bra pads to absorb leaked breast milk. Leaking of breast milk between feedings is normal.   °· Use lanolin on your nipples after breastfeeding. Lanolin helps to maintain your skin's normal moisture barrier. If you use pure lanolin, you do not need to wash it off before feeding your baby again. Pure lanolin is not toxic to your baby. You may also hand express a few drops of breast milk and gently massage that milk into your nipples and allow the milk to air dry. °In the first few weeks after giving birth, some women experience extremely full breasts (engorgement). Engorgement can make your breasts feel heavy, warm, and tender to the touch. Engorgement peaks within 3-5 days after you give birth. The following recommendations can help ease engorgement: °· Completely empty your breasts while breastfeeding or pumping. You may want  to start by applying warm, moist heat (in the shower or with warm water-soaked hand towels) just before feeding or pumping. This increases circulation and helps the milk flow. If your baby does not completely empty your breasts while breastfeeding, pump any extra milk after he or she is finished. °· Wear a snug bra (nursing or regular) or tank top for 1-2 days to signal your body to slightly decrease milk production. °· Apply ice packs to your breasts, unless this is too uncomfortable for you. °· Make sure that your baby is latched on and positioned properly while breastfeeding. °If engorgement persists after 48 hours of following these recommendations, contact your health care provider or a lactation consultant. °OVERALL HEALTH CARE RECOMMENDATIONS WHILE BREASTFEEDING °· Eat healthy foods. Alternate between meals and snacks, eating 3 of each per day. Because what you eat affects your breast milk, some of the foods may make your baby more irritable than usual. Avoid eating these foods if you are sure that they are negatively affecting your baby. °· Drink milk, fruit juice, and water to satisfy your thirst (about 10 glasses a day).   °· Rest often, relax,   and continue to take your prenatal vitamins to prevent fatigue, stress, and anemia. °· Continue breast self-awareness checks. °· Avoid chewing and smoking tobacco. °· Avoid alcohol and drug use. °Some medicines that may be harmful to your baby can pass through breast milk. It is important to ask your health care provider before taking any medicine, including all over-the-counter and prescription medicine as well as vitamin and herbal supplements. °It is possible to become pregnant while breastfeeding. If birth control is desired, ask your health care provider about options that will be safe for your baby. °SEEK MEDICAL CARE IF:  °· You feel like you want to stop breastfeeding or have become frustrated with breastfeeding. °· You have painful breasts or  nipples. °· Your nipples are cracked or bleeding. °· Your breasts are red, tender, or warm. °· You have a swollen area on either breast. °· You have a fever or chills. °· You have nausea or vomiting. °· You have drainage other than breast milk from your nipples. °· Your breasts do not become full before feedings by the fifth day after you give birth. °· You feel sad and depressed. °· Your baby is too sleepy to eat well. °· Your baby is having trouble sleeping.   °· Your baby is wetting less than 3 diapers in a 24-hour period. °· Your baby has less than 3 stools in a 24-hour period. °· Your baby's skin or the white part of his or her eyes becomes yellow.   °· Your baby is not gaining weight by 5 days of age. °SEEK IMMEDIATE MEDICAL CARE IF:  °· Your baby is overly tired (lethargic) and does not want to wake up and feed. °· Your baby develops an unexplained fever. °Document Released: 07/13/2005 Document Revised: 07/18/2013 Document Reviewed: 01/04/2013 °[employer]® Patient Information ©2015 [employer]. This information is not intended to replace advice given to you by your health care provider. Make sure you discuss any questions you have with your health care provider. ° °

## 2015-04-15 NOTE — Plan of Care (Signed)
Problem: Discharge Progression Outcomes Goal: MMR given as ordered Outcome: Not Applicable Date Met:  42/47/31 Mom declined MMR

## 2015-04-15 NOTE — Care Management Important Message (Signed)
Important Message  Patient Details  Name: Taylor Ferguson MRN: [id number] Date of Birth: 01/12/1981   Medicare Important Message Given:  Yes-second notification given    Shelda Altes 04/15/2015, 9:25 AMImportant Message  Patient Details  Name: Taylor Ferguson MRN: [id number] Date of Birth: 1981/04/12   Medicare Important Message Given:  Yes-second notification given    Shelda Altes 04/15/2015, 9:25 AM

## 2015-04-15 NOTE — Progress Notes (Signed)
Patient ID: Taylor Ferguson, female   DOB: [birth year], 34 y.o.   MRN: [id number] PPD # 1 SVD  S:  Reports feeling well, desires early discharge             Tolerating po/ No nausea or vomiting             Bleeding is light             Pain controlled with ibuprofen (OTC)             Up ad lib / ambulatory / voiding without difficulties    Newborn  Information for the patient's newborn:  Taylor, Ferguson [[id number]]  female  breast feeding  / Circumcision planning   O:  A & O x 3, in no apparent distress              VS:  Filed Vitals:   04/14/15 1812 04/14/15 1854 04/14/15 2315 04/15/15 0400  BP: 111/69 117/79 114/71 107/66  Pulse: 76 78 67 73  Temp: 98.2 F (36.8 C) 97.9 F (36.6 C) 97.6 F (36.4 C) 98.4 F (36.9 C)  TempSrc: Oral Oral Oral Oral  Resp: 18 20 18 18   Height:      Weight:      SpO2:  98% 99% 99%    LABS:  Recent Labs  04/13/15 1815 04/14/15 0635  WBC 10.7* 14.5*  HGB 9.7* 8.4*  HCT 29.9* 26.1*  PLT 308 254    Blood type: --/--/A NEG (09/17 1815) / Infant Rh Neg - Rhophylac NOT indicated  Rubella: Nonimmune (02/25 0000)   I&O: I/O last 3 completed shifts: In: 800 [I.V.:800] Out: 1885 [VVOHY:0737; Blood:510]             Lungs: Clear and unlabored  Heart: regular rate and rhythm / no murmurs  Abdomen: soft, non-tender, non-distended              Fundus: firm, non-tender, U-2  Perineum: intact  Incision: C/D/I - skin well-approximated with Dermabond   Lochia: minimal  Extremities: No edema, no calf pain or tenderness, no Homans    A/P: PPD # 1  34 y.o., [id number]   Principal Problem:    Postpartum care following vaginal delivery (9/18)  Active Problems:    Amniotic fluid leaking    S/P tubal ligation (9/18)   Doing well - stable status  Routine post partum orders  Early discharge home today    Laury Deep, M, MSN, CNM 04/15/2015, 9:32 AM

## 2015-04-15 NOTE — Anesthesia Postprocedure Evaluation (Signed)
  Anesthesia Post-op Note  Patient: Taylor Ferguson  Procedure(s) Performed: Procedure(s): POST PARTUM TUBAL LIGATION (Bilateral)  Patient Location: Mother/Baby  Anesthesia Type:Epidural  Level of Consciousness: awake, alert  and oriented  Airway and Oxygen Therapy: Patient Spontanous Breathing  Post-op Pain: none  Post-op Assessment: Post-op Vital signs reviewed, Patient's Cardiovascular Status Stable, Respiratory Function Stable, Patent Airway, No signs of Nausea or vomiting, Adequate PO intake and Pain level controlled LLE Motor Response: Purposeful movement LLE Sensation: Tingling RLE Motor Response: Purposeful movement RLE Sensation: Tingling      Post-op Vital Signs: Reviewed and stable  Last Vitals:  Filed Vitals:   04/15/15 0400  BP: 107/66  Pulse: 73  Temp: 36.9 C  Resp: 18    Complications: No apparent anesthesia complications

## 2015-04-15 NOTE — Discharge Summary (Signed)
POSTOPERATIVE DISCHARGE SUMMARY:  Patient ID: Taylor Ferguson MRN: [id number] DOB/AGE: [birth year] 34 y.o.  Admit date: 04/13/2015 Admission Diagnoses: Active Labor   Discharge date:   Discharge Diagnoses: S/P SVD and BTL on 04/14/2015        Prenatal history: [id number]   EDC : 04/30/2015, by LMP  Has received prenatal care at [facility] since 8.[redacted] wks gestation. Primary provider : Dr. 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 History of chicken pox   . Heart murmur   . Heart palpitations   . Anemia     history of  . Depression   . Waardenburg syndrome   . S/P tubal ligation (9/18) 04/15/2015    Past surgical history:  Past Surgical History  Procedure Laterality Date  . Eye surgery      bilateral -- Waardenburg Syndrome  . Wisdom tooth extraction       Allergies: Cinnamon   Intrapartum Course:  Admitted for SROM and active labor / AROM of forebag with bloody fluid / IUPC / epidural for pain management / SVD of viable female over intact perineum by Dr. Benjie Karvonen / Laparoscopic BTL 12 hrs after delivery / no immediate postpartum complications noted - pt desires early discharge  Physical Exam:   VSS: Blood pressure 107/66, pulse 73, temperature 98.4 F (36.9 C), temperature source Oral, resp. rate 18, height 5\' 6"  (1.676 m), weight 76.204 kg (168 lb), SpO2 99 %, currently breastfeeding.  LABS:  Recent Labs  04/13/15 1815 04/14/15 0635  WBC 10.7* 14.5*  HGB 9.7* 8.4*  PLT 308 254    Newborn Data Live born female on  04/14/2015 Birth Weight: 6 lb 12.6 oz (3079 g) APGAR: 8, 9  See operative report for further details  Home with mother.  Discharge Instructions:  Wound Care: keep clean and dry  Postpartum Instructions: [facility] discharge booklet - instructions reviewed Medications:    Medication List    TAKE these medications        acetaminophen 500 MG tablet  Commonly known as:  TYLENOL  Take 1,000 mg by mouth every 6 (six) hours as needed for mild pain.     calcium carbonate 500 MG chewable tablet  Commonly known as:  TUMS - dosed in mg elemental calcium  Chew 2 tablets by mouth daily as needed for indigestion or heartburn.     ibuprofen 600 MG tablet  Commonly known as:  ADVIL,MOTRIN  Take 1 tablet (600 mg total) by mouth every 6 (six) hours.     iron polysaccharides 150 MG capsule  Commonly known as:  NIFEREX  Take 1 capsule (150 mg total) by mouth 2 (two) times daily.     magnesium oxide 400 (241.3 MG) MG tablet  Commonly known as:  MAG-OX  Take 0.5 tablets (200 mg total) by mouth daily.     ondansetron 4 MG disintegrating tablet  Commonly known as:  ZOFRAN ODT  Take 1 tablet (4 mg total) by mouth every 8 (eight) hours as needed for nausea or vomiting.  pantoprazole 40 MG tablet  Commonly known as:  PROTONIX  Take 1 tablet (40 mg total) by mouth daily before breakfast.     prenatal multivitamin Tabs tablet  Take 1 tablet by mouth daily at 12 noon.     ranitidine 150 MG tablet  Commonly known as:  ZANTAC  Take 150 mg by mouth 2 (two) times daily as needed for heartburn.           Signed: Graceann Congress, MSN, CNM 04/15/2015, 9:43 AM

## 2015-04-15 NOTE — Addendum Note (Signed)
Addendum  created 04/15/15 0746 by Riki Sheer, CRNA   Modules edited: Notes Section   Notes Section:  File: [id number]

## 2015-04-15 NOTE — Progress Notes (Signed)
Tomi Likens, interpreter, here from [phone] to assist with discharge.

## 2015-04-15 NOTE — Progress Notes (Signed)
UR chart review completed.  

## 2015-04-16 LAB — TYPE AND SCREEN
ABO/RH(D): A NEG
Antibody Screen: POSITIVE
DAT, IgG: NEGATIVE
UNIT DIVISION: 0
Unit division: 0

## 2016-08-03 ENCOUNTER — Telehealth: Payer: Self-pay | Admitting: Family

## 2016-08-03 NOTE — Telephone Encounter (Signed)
lvm advising patient to call and schedule medicare wellness appointment.

## 2017-01-13 ENCOUNTER — Ambulatory Visit (INDEPENDENT_AMBULATORY_CARE_PROVIDER_SITE_OTHER): Payer: Medicare HMO | Admitting: Family

## 2017-01-13 ENCOUNTER — Encounter: Payer: Self-pay | Admitting: Family

## 2017-01-13 VITALS — BP 127/92 | HR 90 | Temp 98.7°F | Resp 16 | Ht 66.0 in | Wt 147.0 lb

## 2017-01-13 DIAGNOSIS — R5383 Other fatigue: Secondary | ICD-10-CM

## 2017-01-13 DIAGNOSIS — Z0000 Encounter for general adult medical examination without abnormal findings: Secondary | ICD-10-CM | POA: Diagnosis not present

## 2017-01-13 DIAGNOSIS — R413 Other amnesia: Secondary | ICD-10-CM

## 2017-01-13 DIAGNOSIS — L723 Sebaceous cyst: Secondary | ICD-10-CM

## 2017-01-13 DIAGNOSIS — G4700 Insomnia, unspecified: Secondary | ICD-10-CM | POA: Diagnosis not present

## 2017-01-13 DIAGNOSIS — T148XXA Other injury of unspecified body region, initial encounter: Secondary | ICD-10-CM

## 2017-01-13 LAB — CBC WITH DIFFERENTIAL/PLATELET
BASOS ABS: 0 10*3/uL (ref 0.0–0.1)
BASOS PCT: 0.7 % (ref 0.0–3.0)
Eosinophils Absolute: 0.1 10*3/uL (ref 0.0–0.7)
Eosinophils Relative: 1.4 % (ref 0.0–5.0)
HEMATOCRIT: 40.5 % (ref 36.0–46.0)
HEMOGLOBIN: 13.5 g/dL (ref 12.0–15.0)
LYMPHS PCT: 19.2 % (ref 12.0–46.0)
Lymphs Abs: 1.3 10*3/uL (ref 0.7–4.0)
MCHC: 33.3 g/dL (ref 30.0–36.0)
MCV: 89.2 fl (ref 78.0–100.0)
MONOS PCT: 11 % (ref 3.0–12.0)
Monocytes Absolute: 0.7 10*3/uL (ref 0.1–1.0)
Neutro Abs: 4.6 10*3/uL (ref 1.4–7.7)
Neutrophils Relative %: 67.7 % (ref 43.0–77.0)
Platelets: 309 10*3/uL (ref 150.0–400.0)
RBC: 4.54 Mil/uL (ref 3.87–5.11)
RDW: 13 % (ref 11.5–15.5)
WBC: 6.8 10*3/uL (ref 4.0–10.5)

## 2017-01-13 LAB — BASIC METABOLIC PANEL
BUN: 17 mg/dL (ref 6–23)
CHLORIDE: 104 meq/L (ref 96–112)
CO2: 26 meq/L (ref 19–32)
CREATININE: 0.72 mg/dL (ref 0.40–1.20)
Calcium: 9.8 mg/dL (ref 8.4–10.5)
GFR: 97.89 mL/min (ref >60.00)
Glucose, Bld: 95 mg/dL (ref 70–99)
Potassium: 3.9 mEq/L (ref 3.5–5.1)
SODIUM: 138 meq/L (ref 135–145)

## 2017-01-13 LAB — URINALYSIS, ROUTINE W REFLEX MICROSCOPIC
Bilirubin Urine: NEGATIVE
KETONES UR: NEGATIVE
LEUKOCYTES UA: NEGATIVE
Nitrite: NEGATIVE
PH: 6 (ref 5.0–8.0)
SPECIFIC GRAVITY, URINE: 1.02 (ref 1.000–1.030)
TOTAL PROTEIN, URINE-UPE24: NEGATIVE
URINE GLUCOSE: NEGATIVE
UROBILINOGEN UA: 0.2 (ref 0.0–1.0)

## 2017-01-13 LAB — LIPID PANEL
CHOL/HDL RATIO: 3
Cholesterol: 172 mg/dL (ref 0–200)
HDL: 54.3 mg/dL (ref >39.00)
LDL Cholesterol: 101 mg/dL — ABNORMAL HIGH (ref 0–99)
NONHDL: 117.99
Triglycerides: 83 mg/dL (ref 0.0–149.0)
VLDL: 16.6 mg/dL (ref 0.0–40.0)

## 2017-01-13 LAB — HEPATIC FUNCTION PANEL
ALBUMIN: 4.5 g/dL (ref 3.5–5.2)
ALK PHOS: 40 U/L (ref 39–117)
ALT: 11 U/L (ref 0–35)
AST: 13 U/L (ref 0–37)
BILIRUBIN DIRECT: 0.1 mg/dL (ref 0.0–0.3)
TOTAL PROTEIN: 6.9 g/dL (ref 6.0–8.3)
Total Bilirubin: 0.6 mg/dL (ref 0.2–1.2)

## 2017-01-13 LAB — TSH: TSH: 0.72 u[IU]/mL (ref 0.35–4.50)

## 2017-01-13 MED ORDER — TRAZODONE HCL 50 MG PO TABS: 25.0000 mg | ORAL_TABLET | Freq: Every evening | ORAL | 3 refills | Status: DC | PRN

## 2017-01-13 NOTE — Patient Instructions (Addendum)
Please complete lab work prior to leaving. You will be contacted about your referral to the surgeon about removing the cyst on your scalp. Please begin trazadone 1/2 tab at bedtime for sleep. You may increase to a full tab if needed.

## 2017-01-13 NOTE — Progress Notes (Addendum)
Subjective:    Patient ID: Taylor Ferguson, female    DOB: 10-10-1980, 36 y.o.   MRN: [id number]  HPI  Taylor Ferguson is a 36 yr old female who presents today to re-establish care and  for cpx. She is hearing impaired and a sign language interpreter is present for today's visit.   Patient presents today for complete physical.  Immunizations: tetanus 8/14 Diet:  Reports healthy diet Exercise: reports that she stays active, no formal exercise.  Pap Smear: 2016- GYN.   Dental: due- she does not have dental insurance Vision: 2 years ago- will reschedule.   She has several concerns.  She reports that her husband found a "bump" on the back of her head.    Has also noticed since February she has had episodes of "memory loss" or "black outs." Reports that she was in [place] for a business dinner.  Reports no loss of consciousness. Reports that this has happened with "light drinking."  She describes this as 2 drinks. She reports that this has happened several other times.  Each time that this has occurred she has had alcohol. It has never occurred in the absence of alcohol.   Reports easy bruising. She reports + fatigue.  Having issues with insomnia.  Using otc sleep meds. Tried a friend's xanax and that helped.  Wt Readings from Last 3 Encounters:  01/13/17 147 lb (66.7 kg)  04/13/15 168 lb (76.2 kg)  12/12/14 147 lb 5 oz (66.8 kg)     Review of Systems  Constitutional: Negative for unexpected weight change.  HENT: Positive for hearing loss.   Eyes: Negative for visual disturbance.  Cardiovascular: Negative for leg swelling.  Gastrointestinal: Negative for constipation and diarrhea.  Genitourinary: Negative for dysuria and frequency.  Musculoskeletal: Negative for arthralgias and myalgias.  Skin: Negative for rash.  Neurological: Negative for headaches.  Hematological: Negative for adenopathy.  Psychiatric/Behavioral:       Denies depression/anxiety   Past Medical History:    Diagnosis Date  . Anemia    history of  . Depression   . Heart murmur   . Heart palpitations   . History of chicken pox   . S/P tubal ligation (9/18) 04/15/2015  . Waardenburg syndrome      Social History   Social History  . Marital status: Divorced    Spouse name: N/A  . Number of children: 2  . Years of education: N/A   Occupational History  . homemaker    Social History Main Topics  . Smoking status: Never Smoker  . Smokeless tobacco: Never Used  . Alcohol use Yes     Comment: occasional  . Drug use: No  . Sexual activity: Not on file   Other Topics Concern  . Not on file   Social History Narrative   Regular exercise:  Yes   Divorced--engaged   Mom is Taylor Ferguson    Past Surgical History:  Procedure Laterality Date  . EYE SURGERY     bilateral -- Waardenburg Syndrome  . TUBAL LIGATION Bilateral 04/14/2015   Procedure: POST PARTUM TUBAL LIGATION;  Surgeon: Azucena Fallen, MD;  Location: [facility];  Service: Gynecology;  Laterality: Bilateral;  . WISDOM TOOTH EXTRACTION      Family History  Problem Relation Age of Onset  . Diabetes Mother   . Hearing loss Son        deaf  . Stroke Maternal Grandmother   . CAD Maternal Grandfather 5  . CAD Paternal  Grandfather 60    Allergies  Allergen Reactions  . Cinnamon Swelling    Red face    No current outpatient prescriptions on file prior to visit.   No current facility-administered medications on file prior to visit.     BP (!) 127/92 (BP Location: Right Arm, Cuff Size: Normal)   Pulse 90   Temp 98.7 F (37.1 C) (Oral)   Resp 16   Ht 5\' 6"  (1.676 m)   Wt 147 lb (66.7 kg)   LMP 12/27/2016   SpO2 100%   BMI 23.73 kg/m       Objective:   Physical Exam  Physical Exam  Constitutional: She is oriented to person, place, and time. She appears well-developed and well-nourished. No distress.  HENT:  Head: Normocephalic and atraumatic.  Right Ear: Tympanic membrane and ear canal normal.  Left  Ear: Tympanic membrane and ear canal normal.  Mouth/Throat: Oropharynx is clear and moist.  Eyes: Pupils are equal, round, and reactive to light. No scleral icterus. (right eye is blue and left eye is brown) Neck: Normal range of motion. No thyromegaly present.  Cardiovascular: Normal rate and regular rhythm.   No murmur heard. Pulmonary/Chest: Effort normal and breath sounds normal. No respiratory distress. He has no wheezes. She has no rales. She exhibits no tenderness.  Abdominal: Soft. Bowel sounds are normal. She exhibits no distension and no mass. There is no tenderness. There is no rebound and no guarding.  Musculoskeletal: She exhibits no edema.  Lymphadenopathy:    She has no cervical adenopathy.  Neurological: She is alert and oriented to person, place, and time. She has normal patellar reflexes. She exhibits normal muscle tone. Coordination normal.  Skin: Skin is warm and dry. multiple tatoos.  + firm cyst noted beneath skin in right occipital area Psychiatric: She has a normal mood and affect. Her behavior is normal. Judgment and thought content normal.  Breasts: Examined lying Right: Without masses, retractions, discharge or axillary adenopathy.  Left: Without masses, retractions, discharge or axillary adenopathy.  Pelvic: deferred         Assessment & Plan:         Assessment & Plan:  Preventative Care- immunizations reviewed and up to date.  Obtain routine lab work. Pap up to date.   Sebaceous cyst- will refer to surgeon for excision.   Insomnia- new. Will give trial of trazadone.   Bruising- check cbc  Fatigue- check TSH  Memory problem- only happens after drinking alcohol.  Since it has not happened outside of alcohol use, I don't think that a neuro work up is indicated. I advised her to abstain from alcohol use.

## 2017-01-29 ENCOUNTER — Encounter: Payer: Self-pay | Admitting: Family

## 2017-01-29 MED ORDER — TRAZODONE HCL 50 MG PO TABS: 25.0000 mg | ORAL_TABLET | Freq: Every evening | ORAL | 3 refills | Status: DC | PRN

## 2017-02-02 DIAGNOSIS — G44229 Chronic tension-type headache, not intractable: Secondary | ICD-10-CM | POA: Diagnosis not present

## 2017-02-12 ENCOUNTER — Ambulatory Visit: Payer: Medicare HMO | Admitting: Family

## 2017-02-19 ENCOUNTER — Encounter: Payer: Self-pay | Admitting: Family

## 2017-02-19 ENCOUNTER — Ambulatory Visit (INDEPENDENT_AMBULATORY_CARE_PROVIDER_SITE_OTHER): Payer: Medicare HMO | Admitting: Family

## 2017-02-19 DIAGNOSIS — Z79899 Other long term (current) drug therapy: Secondary | ICD-10-CM | POA: Diagnosis not present

## 2017-02-19 DIAGNOSIS — G4700 Insomnia, unspecified: Secondary | ICD-10-CM | POA: Insufficient documentation

## 2017-02-19 MED ORDER — ZOLPIDEM TARTRATE 5 MG PO TABS: 5.0000 mg | ORAL_TABLET | Freq: Every evening | ORAL | 0 refills | Status: DC | PRN

## 2017-02-19 NOTE — Progress Notes (Signed)
Subjective:    Patient ID: Taylor Ferguson, female    DOB: Dec 03, 1980, 36 y.o.   MRN: [id number]  HPI  Taylor Ferguson is a 36 year old female who presents today for follow-up of her insomnia. Last visit we gave her a trial of trazodone. She reports that she continues to wake up during the night despite use of trazodone.  Reports that she has trouble falling asleep and then wakes up during the night and can't fall back to sleep.  Reports that she has tried some otc meds.   Review of Systems    see HPI  Past Medical History:  Diagnosis Date  . Anemia    history of  . Depression   . Heart murmur   . Heart palpitations   . History of chicken pox   . S/P tubal ligation (9/18) 04/15/2015  . Waardenburg syndrome      Social History   Social History  . Marital status: Divorced    Spouse name: N/A  . Number of children: 2  . Years of education: N/A   Occupational History  . homemaker    Social History Main Topics  . Smoking status: Never Smoker  . Smokeless tobacco: Never Used  . Alcohol use Yes     Comment: occasional  . Drug use: No  . Sexual activity: Not on file   Other Topics Concern  . 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 EYE SURGERY     bilateral -- Waardenburg Syndrome  . TUBAL LIGATION Bilateral 04/14/2015   Procedure: POST PARTUM TUBAL LIGATION;  Surgeon: Azucena Fallen, MD;  Location: [facility];  Service: Gynecology;  Laterality: Bilateral;  . WISDOM TOOTH EXTRACTION      Family History  Problem Relation Age of Onset  . Diabetes Mother   . Hearing loss Son        deaf  . Stroke Maternal Grandmother   . CAD Maternal Grandfather 106  . CAD Paternal Grandfather 91    Allergies  Allergen Reactions  . Cinnamon  Swelling    Red face    Current Outpatient Prescriptions on File Prior to Visit  Medication Sig Dispense Refill  . traZODone (DESYREL) 50 MG tablet Take 0.5-1 tablets (25-50 mg total) by mouth at bedtime as needed for sleep. 30 tablet 3   No current facility-administered medications on file prior to visit.     BP (!) 123/94   Pulse 86   Temp 98.4 F (36.9 C) (Oral)   Ht 5\' 6"  (1.676 m)   Wt 144 lb 12.8 oz (65.7 kg)   SpO2 99%   BMI 23.37 kg/m    The heart Objective:   Physical Exam  Constitutional: She is oriented to person, place, and time. She appears well-developed and well-nourished.  Cardiovascular: Normal rate, regular rhythm and normal heart sounds.   No murmur heard. Pulmonary/Chest: Effort normal and breath sounds normal. No respiratory distress. She has no wheezes.  Neurological: She is alert and oriented to person, place, and time.  Psychiatric: She has a normal mood and affect. Her behavior is normal. Judgment and thought content normal.          Assessment & Plan:

## 2017-02-19 NOTE — Assessment & Plan Note (Signed)
Uncontrolled despite addition of trazodone. She has failed over-the-counter medications. Will DC trazodone, and instead give trial of Ambien 5 mg I mouth once daily. A controlled substance contract is signed today and we will obtain a urine drug screen. She is advised to let me know in a few weeks how this is working for her via my chart.

## 2017-02-19 NOTE — Progress Notes (Signed)
See letter.

## 2017-02-19 NOTE — Patient Instructions (Addendum)
Please begin ambien at bedtime as needed for sleep. Stop trazodone Let me know via mychart in a few weeks how this is working for you.

## 2017-03-12 ENCOUNTER — Ambulatory Visit (INDEPENDENT_AMBULATORY_CARE_PROVIDER_SITE_OTHER): Payer: Medicare HMO | Admitting: Family

## 2017-03-12 ENCOUNTER — Encounter: Payer: Self-pay | Admitting: Family

## 2017-03-12 VITALS — BP 136/98 | HR 88 | Temp 98.6°F | Resp 16 | Ht 66.0 in | Wt 144.6 lb

## 2017-03-12 DIAGNOSIS — I10 Essential (primary) hypertension: Secondary | ICD-10-CM | POA: Insufficient documentation

## 2017-03-12 DIAGNOSIS — R059 Cough, unspecified: Secondary | ICD-10-CM

## 2017-03-12 DIAGNOSIS — R05 Cough: Secondary | ICD-10-CM | POA: Diagnosis not present

## 2017-03-12 HISTORY — DX: Essential (primary) hypertension: I10

## 2017-03-12 MED ORDER — AMLODIPINE BESYLATE 5 MG PO TABS: 5.0000 mg | ORAL_TABLET | Freq: Every day | ORAL | 1 refills | Status: DC

## 2017-03-12 MED ORDER — LORATADINE 10 MG PO TABS: 10.0000 mg | ORAL_TABLET | Freq: Every day | ORAL | 11 refills | Status: DC

## 2017-03-12 NOTE — Progress Notes (Signed)
Subjective:    Patient ID: Taylor Ferguson, female    DOB: 08-13-1980, 36 y.o.   MRN: [id number]  HPI  Ms. Taylor Ferguson is a 36 yr old female who presents today with chief complaint of cough. Cough as been present x 1 month. Has been sneezing the last few days.  Cough is dry. Denies known fever.  Has nausea drainage x 2 days only. Denies gerd symptoms.    BP Readings from Last 3 Encounters:  03/12/17 (!) 136/98  02/19/17 (!) 123/94  01/13/17 (!) 127/92      Review of Systems    see HPI  Past Medical History:  Diagnosis Date  . Anemia    history of  . Depression   . Heart murmur   . Heart palpitations   . History of chicken pox   . S/P tubal ligation (9/18) 04/15/2015  . Waardenburg syndrome      Social History   Social History  . Marital status: Divorced    Spouse name: N/A  . Number of children: 2  . Years of education: N/A   Occupational History  . homemaker    Social History Main Topics  . Smoking status: Never Smoker  . Smokeless tobacco: Never Used  . Alcohol use Yes     Comment: occasional  . Drug use: No  . Sexual activity: Not on file   Other Topics Concern  . 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 EYE SURGERY     bilateral -- Waardenburg Syndrome  . TUBAL LIGATION Bilateral 04/14/2015   Procedure: POST PARTUM TUBAL LIGATION;  Surgeon: Azucena Fallen, MD;  Location: [facility];  Service: Gynecology;  Laterality: Bilateral;  . WISDOM TOOTH EXTRACTION      Family History  Problem Relation Age of Onset  . Diabetes Mother   . Hearing loss Son        deaf  . Stroke Maternal Grandmother   . CAD Maternal Grandfather 57  . CAD Paternal Grandfather 29    Allergies  Allergen Reactions  . Cinnamon Swelling   Red face    Current Outpatient Prescriptions on File Prior to Visit  Medication Sig Dispense Refill  . zolpidem (AMBIEN) 5 MG tablet Take 1 tablet (5 mg total) by mouth at bedtime as needed for sleep. 30 tablet 0   No current facility-administered medications on file prior to visit.     BP (!) 136/98 (BP Location: Left Arm, Cuff Size: Normal)   Pulse 88   Temp 98.6 F (37 C) (Oral)   Resp 16   Ht 5\' 6"  (1.676 m)   Wt 144 lb 9.6 oz (65.6 kg)   LMP 03/12/2017   SpO2 98%   BMI 23.34 kg/m    Objective:   Physical Exam  Constitutional: She appears well-developed and well-nourished.  HENT:  Head: Normocephalic and atraumatic.  Right Ear: Tympanic membrane and ear canal normal.  Left Ear: Tympanic membrane and ear canal normal.  Mouth/Throat: No oropharyngeal exudate or posterior oropharyngeal edema.  Cardiovascular: Normal rate, regular rhythm and normal heart sounds.   No murmur heard. Pulmonary/Chest: Effort normal and breath sounds normal. No respiratory distress. She has no wheezes.  Psychiatric: She has a normal mood and affect. Her behavior is normal. Judgment and thought content normal.          Assessment & Plan:  Cough- ? Allergies. Trial of claritin, check baseline cxr. If symptoms do not improve consider trial of PPI.  HTN- persistently elevated DBP. Start amlodipine.

## 2017-03-12 NOTE — Patient Instructions (Addendum)
Please complete chest x-ray on the first floor. Begin amlodipine once daily for blood pressure. Add claritin once daily. Let me know in about 2 weeks via mychart if your cough is not improved.

## 2017-03-22 ENCOUNTER — Encounter: Payer: Self-pay | Admitting: Family

## 2017-03-23 ENCOUNTER — Other Ambulatory Visit: Payer: Self-pay | Admitting: Family

## 2017-03-23 MED ORDER — OMEPRAZOLE 40 MG PO CPDR: 40.0000 mg | DELAYED_RELEASE_CAPSULE | Freq: Every day | ORAL | 3 refills | Status: DC

## 2017-04-07 ENCOUNTER — Encounter: Payer: Self-pay | Admitting: Family

## 2017-04-07 ENCOUNTER — Ambulatory Visit (INDEPENDENT_AMBULATORY_CARE_PROVIDER_SITE_OTHER): Payer: Medicare HMO | Admitting: Family

## 2017-04-07 VITALS — BP 133/81 | HR 80 | Temp 98.2°F | Resp 16 | Ht 66.0 in | Wt 146.8 lb

## 2017-04-07 DIAGNOSIS — I10 Essential (primary) hypertension: Secondary | ICD-10-CM

## 2017-04-07 MED ORDER — AMLODIPINE BESYLATE 5 MG PO TABS: 5.0000 mg | ORAL_TABLET | Freq: Every day | ORAL | 0 refills | Status: DC

## 2017-04-07 NOTE — Progress Notes (Signed)
Subjective:    Patient ID: Taylor Ferguson, female    DOB: [birth year], 36 y.o.   MRN: [id number]  HPI  Taylor Ferguson is a 36 year old female who presents today for follow-up of her hypertension. She is accompanied by a sign language interpreter today. Last visit we began amlodipine 5 mg once daily for blood pressure.She had pedal edema one day. Otherwise has tolerated the amlodipine without difficulty.     BP Readings from Last 3 Encounters:  04/07/17 133/81  03/12/17 (!) 136/98  02/19/17 (!) 123/94     Review of Systems See HPI  Past Medical History:  Diagnosis Date  . Anemia    history of  . Depression   . Heart murmur   . Heart palpitations   . History of chicken pox   . Hypertension 03/12/2017  . S/P tubal ligation (9/18) 04/15/2015  . Waardenburg syndrome      Social History   Social History  . Marital status: Divorced    Spouse name: N/A  . Number of children: 2  . Years of education: N/A   Occupational History  . homemaker    Social History Main Topics  . Smoking status: Never Smoker  . Smokeless tobacco: Never Used  . Alcohol use Yes     Comment: occasional  . Drug use: No  . Sexual activity: Not on file   Other Topics Concern  . 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 EYE SURGERY     bilateral -- Waardenburg Syndrome  . TUBAL LIGATION Bilateral 04/14/2015   Procedure: POST PARTUM TUBAL LIGATION;  Surgeon: Azucena Fallen, MD;  Location: [facility];  Service: Gynecology;  Laterality: Bilateral;  . WISDOM TOOTH EXTRACTION      Family History  Problem Relation Age of Onset  . Diabetes Mother   . Hearing loss Son        deaf  . Stroke Maternal Grandmother   . CAD Maternal Grandfather 74  . CAD Paternal  Grandfather 31    Allergies  Allergen Reactions  . Cinnamon Swelling    Red face    Current Outpatient Prescriptions on File Prior to Visit  Medication Sig Dispense Refill  . amLODipine (NORVASC) 5 MG tablet Take 1 tablet (5 mg total) by mouth daily. 30 tablet 1  . omeprazole (PRILOSEC) 40 MG capsule Take 1 capsule (40 mg total) by mouth daily. 30 capsule 3  . zolpidem (AMBIEN) 5 MG tablet Take 1 tablet (5 mg total) by mouth at bedtime as needed for sleep. 30 tablet 0   No current facility-administered medications on file prior to visit.     BP 133/81 (BP Location: Left Arm, Cuff Size: Normal)   Pulse 80   Temp 98.2 F (36.8 C) (Oral)   Resp 16   Ht 5\' 6"  (1.676 m)   Wt 146 lb 12.8 oz (66.6 kg)   LMP 03/12/2017   SpO2 100%   BMI 23.69 kg/m       Objective:   Physical Exam  Constitutional: She is oriented to person, place, and time. She appears well-developed and well-nourished.  Cardiovascular: Normal rate, regular rhythm and normal heart sounds.   No murmur heard.  Pulmonary/Chest: Effort normal and breath sounds normal. No respiratory distress. She has no wheezes.  Musculoskeletal: She exhibits no edema.  Neurological: She is alert and oriented to person, place, and time.  Psychiatric: She has a normal mood and affect. Her behavior is normal. Judgment and thought content normal.          Assessment & Plan:  HTN- BP Is improved on  amlodipine. She is advised to continue current dose. she is advised to let me know if the pedal edema becomes more frequent or bothersome. Otherwise will see her back in 3 months.

## 2017-04-07 NOTE — Patient Instructions (Signed)
-   Continue amlodipine ?

## 2017-04-11 DIAGNOSIS — M791 Myalgia: Secondary | ICD-10-CM | POA: Diagnosis not present

## 2017-04-11 DIAGNOSIS — R0789 Other chest pain: Secondary | ICD-10-CM | POA: Diagnosis not present

## 2017-04-11 DIAGNOSIS — Z79899 Other long term (current) drug therapy: Secondary | ICD-10-CM | POA: Diagnosis not present

## 2017-04-11 DIAGNOSIS — H9193 Unspecified hearing loss, bilateral: Secondary | ICD-10-CM | POA: Diagnosis not present

## 2017-04-11 DIAGNOSIS — I252 Old myocardial infarction: Secondary | ICD-10-CM | POA: Diagnosis not present

## 2017-04-11 DIAGNOSIS — R079 Chest pain, unspecified: Secondary | ICD-10-CM | POA: Diagnosis not present

## 2017-04-11 DIAGNOSIS — I10 Essential (primary) hypertension: Secondary | ICD-10-CM | POA: Diagnosis not present

## 2017-04-11 DIAGNOSIS — R002 Palpitations: Secondary | ICD-10-CM | POA: Diagnosis not present

## 2017-04-11 DIAGNOSIS — M79602 Pain in left arm: Secondary | ICD-10-CM | POA: Diagnosis not present

## 2017-04-12 DIAGNOSIS — H9193 Unspecified hearing loss, bilateral: Secondary | ICD-10-CM | POA: Diagnosis not present

## 2017-04-12 DIAGNOSIS — R079 Chest pain, unspecified: Secondary | ICD-10-CM | POA: Diagnosis not present

## 2017-04-12 DIAGNOSIS — M79602 Pain in left arm: Secondary | ICD-10-CM | POA: Diagnosis not present

## 2017-04-12 DIAGNOSIS — R0789 Other chest pain: Secondary | ICD-10-CM | POA: Diagnosis not present

## 2017-04-12 DIAGNOSIS — I252 Old myocardial infarction: Secondary | ICD-10-CM | POA: Diagnosis not present

## 2017-04-12 DIAGNOSIS — I10 Essential (primary) hypertension: Secondary | ICD-10-CM | POA: Diagnosis not present

## 2017-04-20 ENCOUNTER — Ambulatory Visit: Payer: Medicare HMO | Admitting: Family

## 2017-04-21 ENCOUNTER — Ambulatory Visit (INDEPENDENT_AMBULATORY_CARE_PROVIDER_SITE_OTHER): Payer: Medicare HMO | Admitting: Family

## 2017-04-21 VITALS — BP 148/100 | HR 93 | Temp 98.1°F | Resp 16 | Ht 66.0 in | Wt 147.0 lb

## 2017-04-21 DIAGNOSIS — I10 Essential (primary) hypertension: Secondary | ICD-10-CM | POA: Diagnosis not present

## 2017-04-21 DIAGNOSIS — R0789 Other chest pain: Secondary | ICD-10-CM

## 2017-04-21 MED ORDER — MELOXICAM 7.5 MG PO TABS: 7.5000 mg | ORAL_TABLET | Freq: Every day | ORAL | 0 refills | Status: DC

## 2017-04-21 MED ORDER — AMLODIPINE BESYLATE 10 MG PO TABS: 10.0000 mg | ORAL_TABLET | Freq: Every day | ORAL | 0 refills | Status: DC

## 2017-04-21 NOTE — Patient Instructions (Signed)
Please increase amlodipine to 10 mg. Add meloxicam once daily for the next 1-2 weeks to see if it helps with your left arm pain.

## 2017-04-21 NOTE — Progress Notes (Signed)
Subjective:    Patient ID: Taylor Ferguson, female    DOB: Sep 28, 1980, 36 y.o.   MRN: [id number]  HPI  [name] is a 36 yr old female who presents today for ER follow up.She is accompanied today by a sign language interpreter.  ER record is reviewed in care everywhere.  She had non-ischemic EKG neg troponins.  She had a neg exercise stress test on 9/17. She reports occasional chest soreness and pinch.  Has some left hand numbness, pain radiates down the left are and some neck.  Comes and goes.     HTN- patient is maintained on amlodipine 5mg .   BP Readings from Last 3 Encounters:  04/21/17 (!) 148/100  04/07/17 133/81  03/12/17 (!) 136/98     Review of Systems See HPI  Past Medical History:  Diagnosis Date  . Anemia    history of  . Depression   . Heart murmur   . Heart palpitations   . History of chicken pox   . Hypertension 03/12/2017  . S/P tubal ligation (9/18) 04/15/2015  . Waardenburg syndrome      Social History   Social History  . Marital status: Divorced    Spouse name: N/A  . Number of children: 2  . Years of education: N/A   Occupational History  . homemaker    Social History Main Topics  . Smoking status: Never Smoker  . Smokeless tobacco: Never Used  . Alcohol use Yes     Comment: occasional  . Drug use: No  . Sexual activity: Not on file   Other Topics Concern  . 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 EYE SURGERY     bilateral -- Waardenburg Syndrome  . TUBAL LIGATION Bilateral 04/14/2015   Procedure: POST PARTUM TUBAL LIGATION;  Surgeon: Azucena Fallen, MD;  Location: [facility];  Service: Gynecology;  Laterality: Bilateral;  . WISDOM TOOTH EXTRACTION      Family History  Problem Relation Age of  Onset  . Diabetes Mother   . Hearing loss Son        deaf  . Stroke Maternal Grandmother   . CAD Maternal Grandfather 29  . CAD Paternal Grandfather 97    Allergies  Allergen Reactions  . Cinnamon Swelling    Red face    Current Outpatient Prescriptions on File Prior to Visit  Medication Sig Dispense Refill  . amLODipine (NORVASC) 5 MG tablet Take 1 tablet (5 mg total) by mouth daily. 90 tablet 0  . zolpidem (AMBIEN) 5 MG tablet Take 1 tablet (5 mg total) by mouth at bedtime as needed for sleep. 30 tablet 0   No current facility-administered medications on file prior to visit.     BP (!) 148/100 (BP Location: Right Arm, Cuff Size: Normal)   Pulse 93   Temp 98.1 F (36.7 C) (Oral)   Resp 16   Ht 5\' 6"  (1.676 m)   Wt 147 lb (66.7 kg)   SpO2 100%   BMI 23.73 kg/m       Objective:   Physical Exam  Constitutional: She is oriented to person, place, and time. She appears well-developed and well-nourished.  Cardiovascular: Normal  rate, regular rhythm and normal heart sounds.   No murmur heard. Pulmonary/Chest: Effort normal and breath sounds normal. No respiratory distress. She has no wheezes.  Neurological: She is alert and oriented to person, place, and time.  Psychiatric: She has a normal mood and affect. Her behavior is normal. Judgment and thought content normal.          Assessment & Plan:  Hypertension-uncontrolled. Will increase amlodipine from 5-10 mg once daily.  Atypical chest pain-negative cardiac workup. Symptoms seem most consistent with a cervical radiculopathy. Will give trial of meloxicam. She is advised to  call if symptoms are not improved in 1-2 weeks.

## 2017-05-03 ENCOUNTER — Other Ambulatory Visit: Payer: Self-pay | Admitting: Plastic Surgery

## 2017-05-03 DIAGNOSIS — Z1231 Encounter for screening mammogram for malignant neoplasm of breast: Secondary | ICD-10-CM

## 2017-05-07 ENCOUNTER — Telehealth: Payer: Self-pay | Admitting: *Deleted

## 2017-05-07 NOTE — Telephone Encounter (Signed)
Request for surgical clearance:  1. What type of surgery is being performed?  Breast Augmentation under MAC/IV sedation  2. When is this surgery scheduled? TBD  3. Are there any medications that need to be held prior to surgery and how long? none  4. Name of physician performing surgery? Hampton A. Lavone Neri, MD  5. What is your office phone and fax number?  Phone [phone], fax [phone]

## 2017-05-10 NOTE — Telephone Encounter (Signed)
    Chart reviewed as part of pre-operative protocol coverage. Because of Taylor Ferguson past medical history and time since last visit, he/she will require a follow-up visit in order to better assess preoperative cardiovascular risk.  [place], [place]  05/10/2017, 2:05 PM

## 2017-05-10 NOTE — Telephone Encounter (Signed)
Left message to have patient call back to schedule appt as patient hasn't been seen since 2014 and will need and OV in order to be cleared for procedure.

## 2017-05-12 NOTE — Telephone Encounter (Signed)
Left message to call back  Scheduled patient appointment with Dr End at [facility] office Friday 05/14/17

## 2017-05-12 NOTE — Telephone Encounter (Signed)
Follow up   Patient called  (sign language service) requesting appointment  for surgical clearance prior to 10/31. Offered to schedule with [facility]  To re- establish care or with APP staff, no dates available before surg date. Patient request call back.

## 2017-05-14 ENCOUNTER — Ambulatory Visit
Admission: RE | Admit: 2017-05-14 | Discharge: 2017-05-14 | Disposition: A | Discharge status: Home / Self Care | Payer: Medicare HMO | Source: Ambulatory Visit | Attending: Plastic Surgery | Admitting: Plastic Surgery

## 2017-05-14 ENCOUNTER — Encounter: Payer: Self-pay | Admitting: Internal Medicine

## 2017-05-14 ENCOUNTER — Ambulatory Visit (INDEPENDENT_AMBULATORY_CARE_PROVIDER_SITE_OTHER): Payer: Medicare HMO | Admitting: Internal Medicine

## 2017-05-14 VITALS — BP 118/90 | HR 104 | Resp 16 | Ht 66.0 in | Wt 146.6 lb

## 2017-05-14 DIAGNOSIS — I10 Essential (primary) hypertension: Secondary | ICD-10-CM | POA: Diagnosis not present

## 2017-05-14 DIAGNOSIS — Z01810 Encounter for preprocedural cardiovascular examination: Secondary | ICD-10-CM

## 2017-05-14 DIAGNOSIS — Z1231 Encounter for screening mammogram for malignant neoplasm of breast: Secondary | ICD-10-CM | POA: Diagnosis not present

## 2017-05-14 NOTE — Patient Instructions (Signed)
Medication Instructions:  Your physician recommends that you continue on your current medications as directed. Please refer to the Current Medication list given to you today.    Labwork: None  Testing/Procedures: None  Follow-Up: Your physician recommends that you schedule a follow-up appointment as needed.   You have been cleared for your surgery.     If you need a refill on your cardiac medications before your next appointment, please call your pharmacy.

## 2017-05-14 NOTE — Progress Notes (Signed)
New Outpatient Visit Date: 05/14/2017  Referring Provider: Lazarus Salines A. Lavone Neri, [address] [place], [place] [address]  Chief Complaint: Preoperative cardiovascular risk assessment  HPI:  Taylor Ferguson is a 36 y.o. female who is being seen today for preoperative cardiovascular risk assessment at the request of Taylor Ferguson. She has a history of hypertension, palpitations (seen approximately 4 years ago by Dr. Percival Spanish), and Waardenburg syndrome with deafness.  She is planning to undergo breast augmentation on 05/26/17 by Dr. Lavone Neri at [facility] plastic surgery.  Taylor Ferguson reports feeling well today  She was seen at [facility] last month due to an episode of left arm pain.  She underwent exercise tolerance test at that time, which was low risk without any ischemic EKG changes.  Taylor Ferguson denies further arm pain as well as chest pain, shortness of breath, palpitations, lightheadedness, orthopnea, and PND.  She has undergone surgeries in the past, most recently a tubal ligation 2 years ago, without any cardiac complications.  She does not exercise on a regular basis but is able to walk and climb at least 2 flights of steps without any symptoms.  --------------------------------------------------------------------------------------------------  Cardiovascular History & Procedures: Cardiovascular Problems:  Hypertension  Risk Factors:  Hypertension  Cath/PCI:  None  CV Surgery:  None  EP Procedures and Devices:  None  Non-Invasive Evaluation(s):  Exercise tolerance test (04/12/17, [facility]): Fair exercise capacity.  No ischemic EKG changes.  Recent CV Pertinent Labs: Lab Results  Component Value Date   CHOL 172 01/13/2017   HDL 54.30 01/13/2017   LDLCALC 101 (H) 01/13/2017   TRIG 83.0 01/13/2017   CHOLHDL 3 01/13/2017   INR 1.09 11/19/2010   K 3.9 01/13/2017   BUN 17 01/13/2017   CREATININE 0.72 01/13/2017   CREATININE 0.67 11/05/2011    --------------------------------------------------------------------------------------------------  Past Medical History:  Diagnosis Date  . Anemia    history of  . Depression   . Heart murmur   . Heart palpitations   . History of chicken pox   . Hypertension 03/12/2017  . S/P tubal ligation (9/18) 04/15/2015  . Waardenburg syndrome     Past Surgical History:  Procedure Laterality Date  . EYE SURGERY     bilateral -- Waardenburg Syndrome  . TUBAL LIGATION Bilateral 04/14/2015   Procedure: POST PARTUM TUBAL LIGATION;  Surgeon: Azucena Fallen, MD;  Location: [facility];  Service: Gynecology;  Laterality: Bilateral;  . WISDOM TOOTH EXTRACTION      Current Meds  Medication Sig  . amLODipine (NORVASC) 10 MG tablet Take 1 tablet (10 mg total) by mouth daily.  . meloxicam (MOBIC) 7.5 MG tablet Take 1 tablet (7.5 mg total) by mouth daily.  Marland Kitchen zolpidem (AMBIEN) 5 MG tablet Take 1 tablet (5 mg total) by mouth at bedtime as needed for sleep.    Allergies: Cinnamon  Social History   Social History  . Marital status: Divorced    Spouse name: N/A  . Number of children: 2  . Years of education: N/A   Occupational History  . homemaker    Social History Main Topics  . Smoking status: Never Smoker  . Smokeless tobacco: Never Used  . Alcohol use Yes     Comment: occasional  . Drug use: No  . Sexual activity: Not on file   Other Topics Concern  . 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 Diabetes Mother   . Hearing loss Son        deaf  . Stroke Maternal Grandmother   . CAD Maternal Grandfather 69  . CAD Paternal Grandfather 105  . Breast cancer Neg Hx     Review of Systems: A  12-system review of systems was performed and was negative except as noted in the HPI.  --------------------------------------------------------------------------------------------------  Physical Exam: BP 118/90   Pulse (!) 104   Resp 16   Ht 5\' 6"  (1.676 m)   Wt 146 lb 9.6 oz (66.5 kg)   LMP 04/27/2017   SpO2 98%   BMI 23.66 kg/m   Repeat BP: 130/80  General: Well-developed, well-nourished woman, seated comfortably in the exam room.  She is accompanied by a sign interpreter. HEENT: No conjunctival pallor or scleral icterus. Moist mucous membranes. OP clear. Neck: Supple without lymphadenopathy, thyromegaly, JVD, or HJR. No carotid bruit. Lungs: Normal work of breathing. Clear to auscultation bilaterally without wheezes or crackles. Heart: Regular rate and rhythm without murmurs, rubs, or gallops. Non-displaced PMI. Abd: Bowel sounds present. Soft, NT/ND without hepatosplenomegaly Ext: No lower extremity edema. Radial, PT, and DP pulses are 2+ bilaterally Skin: Warm and dry without rash. Neuro: CNIII-XII intact. Strength and fine-touch sensation intact in upper and lower extremities bilaterally. Psych: Normal mood and affect.  EKG: Normal sinus rhythm (heart rate 78 bpm) with no abnormalities.  Lab Results  Component Value Date   WBC 6.8 01/13/2017   HGB 13.5 01/13/2017   HCT 40.5 01/13/2017   MCV 89.2 01/13/2017   PLT 309.0 01/13/2017    Lab Results  Component Value Date   NA 138 01/13/2017   K 3.9 01/13/2017   CL 104 01/13/2017   CO2 26 01/13/2017   BUN 17 01/13/2017   CREATININE 0.72 01/13/2017   GLUCOSE 95 01/13/2017   ALT 11 01/13/2017    Lab Results  Component Value Date   CHOL 172 01/13/2017   HDL 54.30 01/13/2017   LDLCALC 101 (H) 01/13/2017   TRIG 83.0 01/13/2017   CHOLHDL 3 01/13/2017    --------------------------------------------------------------------------------------------------  ASSESSMENT AND PLAN: Preoperative cardiovascular risk  assessment Taylor Ferguson is scheduled for breast augmentation later this month.  She does not have any symptoms to suggest unstable cardiovascular disease.  EKG today is normal.  She is able to perform greater than 4 METS of activity without symptoms.  Additionally, she underwent exercise tolerance test last month, which was reassuring without any ischemic changes.  I think it is reasonable for her to proceed with her planned surgery without further cardiac testing or intervention.  She is at low risk for perioperative cardiovascular complications.  Hypertension Blood pressure is upper normal today.  Taylor Ferguson should continue with her current dose of amlodipine and follow with her PCP.  Follow-up: Return to clinic as needed.  Nelva Bush, MD 05/14/2017 2:55 PM

## 2017-05-14 NOTE — Telephone Encounter (Signed)
Taylor Ferguson notified patient of appt/reminder on 05/13/17 per appt note info

## 2017-05-17 NOTE — Telephone Encounter (Signed)
Per OV note dated 05-14-17 ASSESSMENT AND PLAN: Preoperative cardiovascular risk assessment Taylor Ferguson is scheduled for breast augmentation later this month.  She does not have any symptoms to suggest unstable cardiovascular disease.  EKG today is normal.  She is able to perform greater than 4 METS of activity without symptoms.  Additionally, she underwent exercise tolerance test last month, which was reassuring without any ischemic changes.  I think it is reasonable for her to proceed with her planned surgery without further cardiac testing or intervention.  She is at low risk for perioperative cardiovascular complications.  Hypertension Blood pressure is upper normal today.  Taylor Ferguson should continue with her current dose of amlodipine and follow with her PCP.  Follow-up: Return to clinic as needed.  Nelva Bush, MD 05/14/2017 2:55 PM

## 2017-05-21 ENCOUNTER — Ambulatory Visit: Payer: Medicare HMO | Admitting: Family

## 2017-06-07 ENCOUNTER — Ambulatory Visit: Payer: Medicare HMO | Admitting: Family

## 2017-07-07 ENCOUNTER — Ambulatory Visit: Payer: Medicare HMO | Admitting: Family

## 2017-07-12 ENCOUNTER — Other Ambulatory Visit (HOSPITAL_COMMUNITY)
Admission: RE | Admit: 2017-07-12 | Discharge: 2017-07-12 | Disposition: A | Discharge status: Home / Self Care | Payer: Medicare HMO | Source: Ambulatory Visit | Attending: Family | Admitting: Family

## 2017-07-12 ENCOUNTER — Ambulatory Visit (INDEPENDENT_AMBULATORY_CARE_PROVIDER_SITE_OTHER): Payer: Medicare HMO | Admitting: Family

## 2017-07-12 ENCOUNTER — Encounter: Payer: Self-pay | Admitting: Family

## 2017-07-12 VITALS — BP 135/85 | HR 78 | Temp 98.2°F | Resp 16 | Ht 66.0 in | Wt 148.0 lb

## 2017-07-12 DIAGNOSIS — Z113 Encounter for screening for infections with a predominantly sexual mode of transmission: Secondary | ICD-10-CM | POA: Insufficient documentation

## 2017-07-12 DIAGNOSIS — I10 Essential (primary) hypertension: Secondary | ICD-10-CM | POA: Diagnosis not present

## 2017-07-12 DIAGNOSIS — T7421XA Adult sexual abuse, confirmed, initial encounter: Secondary | ICD-10-CM

## 2017-07-12 DIAGNOSIS — G4700 Insomnia, unspecified: Secondary | ICD-10-CM | POA: Diagnosis not present

## 2017-07-12 DIAGNOSIS — Z79899 Other long term (current) drug therapy: Secondary | ICD-10-CM | POA: Diagnosis not present

## 2017-07-12 LAB — BASIC METABOLIC PANEL
BUN: 16 mg/dL (ref 6–23)
CALCIUM: 8.7 mg/dL (ref 8.4–10.5)
CHLORIDE: 105 meq/L (ref 96–112)
CO2: 27 meq/L (ref 19–32)
CREATININE: 0.62 mg/dL (ref 0.40–1.20)
GFR: 115.99 mL/min (ref >60.00)
GLUCOSE: 93 mg/dL (ref 70–99)
Potassium: 3.9 mEq/L (ref 3.5–5.1)
Sodium: 138 mEq/L (ref 135–145)

## 2017-07-12 MED ORDER — AMLODIPINE BESYLATE 10 MG PO TABS
10.0000 mg | ORAL_TABLET | Freq: Every day | ORAL | 0 refills | Status: DC
Start: 2017-07-12 — End: 2018-08-09

## 2017-07-12 MED ORDER — ZOLPIDEM TARTRATE 5 MG PO TABS: 5.0000 mg | ORAL_TABLET | Freq: Every evening | ORAL | 0 refills | Status: DC | PRN

## 2017-07-12 NOTE — Patient Instructions (Signed)
Please complete lab work prior to leaving.   
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
## 2017-07-12 NOTE — Assessment & Plan Note (Signed)
Initial BP elevated, follow up bp

## 2017-07-12 NOTE — Assessment & Plan Note (Signed)
Stable with PRN ambien. Refill provided, obtain routine UDS.

## 2017-07-13 ENCOUNTER — Encounter: Payer: Self-pay | Admitting: Family

## 2017-07-13 LAB — CERVICOVAGINAL ANCILLARY ONLY
Chlamydia: NEGATIVE
Neisseria Gonorrhea: NEGATIVE
TRICH (WINDOWPATH): NEGATIVE

## 2017-07-15 LAB — PAIN MGMT, PROFILE 8 W/CONF, U
6 Acetylmorphine: NEGATIVE ng/mL (ref <10)
ALPHAHYDROXYALPRAZOLAM: 32 ng/mL — AB (ref <25)
AMPHETAMINES: NEGATIVE ng/mL (ref <500)
Alcohol Metabolites: POSITIVE ng/mL — AB (ref <500)
Alphahydroxymidazolam: NEGATIVE ng/mL (ref <50)
Alphahydroxytriazolam: NEGATIVE ng/mL (ref <50)
Aminoclonazepam: NEGATIVE ng/mL (ref <25)
BUPRENORPHINE, URINE: NEGATIVE ng/mL (ref <5)
Benzodiazepines: POSITIVE ng/mL — AB (ref <100)
COCAINE METABOLITE: NEGATIVE ng/mL (ref <150)
CREATININE: 155 mg/dL
Ethyl Glucuronide (ETG): 4338 ng/mL — ABNORMAL HIGH (ref <500)
Ethyl Sulfate (ETS): 701 ng/mL — ABNORMAL HIGH (ref <100)
Hydroxyethylflurazepam: NEGATIVE ng/mL (ref <50)
LORAZEPAM: NEGATIVE ng/mL (ref <50)
MDMA: NEGATIVE ng/mL (ref <500)
Marijuana Metabolite: NEGATIVE ng/mL (ref <20)
NORDIAZEPAM: NEGATIVE ng/mL (ref <50)
OPIATES: NEGATIVE ng/mL (ref <100)
OXIDANT: NEGATIVE ug/mL (ref <200)
Oxazepam: NEGATIVE ng/mL (ref <50)
Oxycodone: NEGATIVE ng/mL (ref <100)
PH: 7.35 (ref 4.5–9.0)
Temazepam: NEGATIVE ng/mL (ref <50)

## 2017-07-15 LAB — HIV ANTIBODY (ROUTINE TESTING W REFLEX): HIV 1&2 Ab, 4th Generation: NONREACTIVE

## 2017-07-15 LAB — RPR: RPR: NONREACTIVE

## 2017-07-15 LAB — HSV 2 ANTIBODY, IGG: HSV 2 Glycoprotein G Ab, IgG: <0.9 index

## 2017-09-23 ENCOUNTER — Encounter: Payer: Self-pay | Admitting: Family

## 2017-10-28 DIAGNOSIS — F900 Attention-deficit hyperactivity disorder, predominantly inattentive type: Secondary | ICD-10-CM | POA: Diagnosis not present

## 2017-11-04 ENCOUNTER — Encounter (INDEPENDENT_AMBULATORY_CARE_PROVIDER_SITE_OTHER): Payer: Self-pay

## 2017-11-04 ENCOUNTER — Encounter: Payer: Self-pay | Admitting: Medical

## 2017-11-04 ENCOUNTER — Ambulatory Visit (HOSPITAL_BASED_OUTPATIENT_CLINIC_OR_DEPARTMENT_OTHER)
Admission: RE | Admit: 2017-11-04 | Discharge: 2017-11-04 | Disposition: A | Discharge status: Home / Self Care | Payer: Medicare HMO | Source: Ambulatory Visit | Attending: Medical | Admitting: Medical

## 2017-11-04 ENCOUNTER — Ambulatory Visit (INDEPENDENT_AMBULATORY_CARE_PROVIDER_SITE_OTHER): Payer: Medicare HMO | Admitting: Medical

## 2017-11-04 VITALS — BP 120/94 | HR 80 | Resp 16 | Ht 66.0 in | Wt 150.6 lb

## 2017-11-04 DIAGNOSIS — M25571 Pain in right ankle and joints of right foot: Secondary | ICD-10-CM | POA: Insufficient documentation

## 2017-11-04 LAB — CBC WITH DIFFERENTIAL/PLATELET
BASOS ABS: 0 10*3/uL (ref 0.0–0.1)
Basophils Relative: 0.5 % (ref 0.0–3.0)
EOS PCT: 0.5 % (ref 0.0–5.0)
Eosinophils Absolute: 0 10*3/uL (ref 0.0–0.7)
HEMATOCRIT: 37.2 % (ref 36.0–46.0)
HEMOGLOBIN: 12.6 g/dL (ref 12.0–15.0)
LYMPHS ABS: 1.1 10*3/uL (ref 0.7–4.0)
LYMPHS PCT: 19.6 % (ref 12.0–46.0)
MCHC: 33.8 g/dL (ref 30.0–36.0)
MCV: 88.6 fl (ref 78.0–100.0)
Monocytes Absolute: 0.5 10*3/uL (ref 0.1–1.0)
Monocytes Relative: 8.8 % (ref 3.0–12.0)
NEUTROS PCT: 70.6 % (ref 43.0–77.0)
Neutro Abs: 3.8 10*3/uL (ref 1.4–7.7)
Platelets: 299 10*3/uL (ref 150.0–400.0)
RBC: 4.2 Mil/uL (ref 3.87–5.11)
RDW: 12.4 % (ref 11.5–15.5)
WBC: 5.4 10*3/uL (ref 4.0–10.5)

## 2017-11-04 LAB — SEDIMENTATION RATE: Sed Rate: 2 mm/hr (ref 0–20)

## 2017-11-04 LAB — URIC ACID: Uric Acid, Serum: 3.5 mg/dL (ref 2.4–7.0)

## 2017-11-04 MED ORDER — DICLOFENAC SODIUM 75 MG PO TBEC: 75.0000 mg | DELAYED_RELEASE_TABLET | Freq: Two times a day (BID) | ORAL | 0 refills | Status: DC

## 2017-11-04 NOTE — Patient Instructions (Signed)
For your right ankle pain over the past month, I do recommend getting x-ray of the ankle today.  Also place orders to get CBC, sed rate and uric acid.  I sent prescription of diclofenac nonsteroid anti-inflammatory to your pharmacy.  See if this helps with the pain and inflammation.  Ace wrap given as well.  You can try rest, ice compression and elevation therapy.  Since the pain is already been present present for 1 month.  We will follow you closely and see how you do.  If not improving then would refer to sports medicine.  Please give [place] an update by next Tuesday.  Could write sports medicine referral at that point.  Follow-up 5 days by my chart or as needed.

## 2017-11-04 NOTE — Progress Notes (Signed)
Subjective:    Patient ID: Taylor Ferguson, female    DOB: February 03, 1981, 37 y.o.   MRN: [id number]  HPI  Pt in with some rt ankle pain and swelling. Pt states was very swollen about 1 month ago. Pt states swelling will come and go. She was exercising at time started swelling. She states some moderate heavy leg work outs. No fall or trauma.  Pt states pain occurs during the day. Worse going up and down stairs. Down stairs is worse. Plantar flexion and dorsi flexion movement increases pain.  Not using anything for pain or inflammation.  Pt states she tried to wrap it but did not help much.  Pain when has/walkinga bout 5/10.   Review of Systems  Constitutional: Negative for chills, fatigue and fever.  Respiratory: Negative for cough, chest tightness, shortness of breath and wheezing.   Cardiovascular: Negative for chest pain and palpitations.  Gastrointestinal: Negative for abdominal pain.  Musculoskeletal: Negative for back pain, myalgias and neck pain.       Rt ankle pain.  Skin: Negative for rash.  Neurological: Negative for dizziness, seizures and light-headedness.  Hematological: Negative for adenopathy. Does not bruise/bleed easily.  Psychiatric/Behavioral: Negative for behavioral problems and confusion.    Past Medical History:  Diagnosis Date  . Anemia    history of  . Depression   . Heart murmur   . Heart palpitations   . History of chicken pox   . Hypertension 03/12/2017  . S/P tubal ligation (9/18) 04/15/2015  . Waardenburg syndrome      Social History   Socioeconomic History  . Marital status: Divorced    Spouse name: Not on file  . Number of children: 2  . Years of education: Not on file  . Highest education level: Not on file  Occupational History  . Occupation: homemaker  Social Needs  . Financial resource strain: Not on file  . Food insecurity:    Worry: Not on file    Inability: Not on file  . Transportation needs:    Medical: Not on file   Non-medical: Not on file  Tobacco Use  . Smoking status: Never Smoker  . Smokeless tobacco: Never Used  Substance and Sexual Activity  . Alcohol use: Yes    Alcohol/week: 1.2 oz    Types: 1 Glasses of wine, 1 Cans of beer per week  . Drug use: No  . Sexual activity: Not on file  Lifestyle  . Physical activity:    Days per week: Not on file    Minutes per session: Not on file  . Stress: Not on file  Relationships  . Social connections:    Talks on phone: Not on file    Gets together: Not on file    Attends religious service: Not on file    Active member of club or organization: Not on file    Attends meetings of clubs or organizations: Not on file    Relationship status: Not on file  . Intimate partner violence:    Fear of current or ex partner: Not on file    Emotionally abused: Not on file    Physically abused: Not on file    Forced sexual activity: Not on file  Other Topics Concern  . 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 EYE SURGERY     bilateral -- Waardenburg Syndrome  . TUBAL LIGATION Bilateral 04/14/2015   Procedure: POST PARTUM TUBAL LIGATION;  Surgeon: Azucena Fallen, MD;  Location: [facility];  Service: Gynecology;  Laterality: Bilateral;  . WISDOM TOOTH EXTRACTION      Family History  Problem Relation Age of Onset  . Diabetes Mother   . Hearing loss Son        deaf  . Stroke Maternal Grandmother   . CAD Maternal Grandfather 4  . Heart attack Maternal Grandfather   . CAD Paternal Grandfather 29  . Breast cancer Neg Hx     Allergies  Allergen Reactions  . Cinnamon Swelling    Red face    Current Outpatient Medications on File Prior to Visit  Medication Sig Dispense Refill  . zolpidem (AMBIEN) 5 MG tablet Take 1  tablet (5 mg total) by mouth at bedtime as needed for sleep. 30 tablet 0  . amLODipine (NORVASC) 10 MG tablet Take 1 tablet (10 mg total) by mouth daily. (Patient not taking: Reported on 11/04/2017) 90 tablet 0   No current facility-administered medications on file prior to visit.     BP (!) 120/94 (BP Location: Left Arm, Patient Position: Sitting, Cuff Size: Normal)   Pulse 80   Resp 16   Ht 5\' 6"  (1.676 m)   Wt 150 lb 9.6 oz (68.3 kg)   SpO2 100%   BMI 24.31 kg/m       Objective:   Physical Exam  General- No acute distress. Pleasant patient. Lungs- Clear, even and unlabored. Heart- regular rate and rhythm. Neurologic- CNII- XII grossly intact.  Rt ankle- lateral aspect tender.  Some pain over the talofibular ligament.  No obvious severe swelling or warmth presently.  Right foot-no pain on palpation of the foot.       Assessment & Plan:  For your right ankle pain over the past month, I do recommend getting x-ray of the ankle today.  Also place orders to get CBC, sed rate and uric acid.  I sent prescription of diclofenac nonsteroid anti-inflammatory to your pharmacy.  See if this helps with the pain and inflammation.  Ace wrap given as well.  You can try rest, ice compression and elevation therapy.  Since the pain is already been present present for 1 month.  We will follow you closely and see how you do.  If not improving then would refer to sports medicine.  Please give [place] an update by next Tuesday.  Could write sports medicine referral at that point.  Follow-up 5 days by my chart or as needed.  Mackie Pai, PA-C

## 2017-12-16 DIAGNOSIS — F438 Other reactions to severe stress: Secondary | ICD-10-CM | POA: Diagnosis not present

## 2018-01-10 ENCOUNTER — Encounter: Payer: Self-pay | Admitting: Family

## 2018-01-10 MED ORDER — DICLOFENAC SODIUM 75 MG PO TBEC
75.0000 mg | DELAYED_RELEASE_TABLET | Freq: Two times a day (BID) | ORAL | 0 refills | Status: DC
Start: 2018-01-10 — End: 2018-04-27

## 2018-01-14 ENCOUNTER — Encounter: Payer: BLUE CROSS/BLUE SHIELD | Admitting: Family

## 2018-01-14 DIAGNOSIS — Z0289 Encounter for other administrative examinations: Secondary | ICD-10-CM

## 2018-01-23 DIAGNOSIS — M25571 Pain in right ankle and joints of right foot: Secondary | ICD-10-CM | POA: Diagnosis not present

## 2018-02-03 DIAGNOSIS — F438 Other reactions to severe stress: Secondary | ICD-10-CM | POA: Diagnosis not present

## 2018-02-09 DIAGNOSIS — F438 Other reactions to severe stress: Secondary | ICD-10-CM | POA: Diagnosis not present

## 2018-02-15 DIAGNOSIS — F438 Other reactions to severe stress: Secondary | ICD-10-CM | POA: Diagnosis not present

## 2018-02-24 DIAGNOSIS — F438 Other reactions to severe stress: Secondary | ICD-10-CM | POA: Diagnosis not present

## 2018-03-03 DIAGNOSIS — F438 Other reactions to severe stress: Secondary | ICD-10-CM | POA: Diagnosis not present

## 2018-03-24 DIAGNOSIS — F438 Other reactions to severe stress: Secondary | ICD-10-CM | POA: Diagnosis not present

## 2018-04-17 ENCOUNTER — Encounter: Payer: Self-pay | Admitting: Family

## 2018-04-22 ENCOUNTER — Ambulatory Visit: Payer: BLUE CROSS/BLUE SHIELD | Admitting: Family

## 2018-04-27 ENCOUNTER — Ambulatory Visit: Payer: BLUE CROSS/BLUE SHIELD | Admitting: Family

## 2018-04-27 ENCOUNTER — Telehealth: Payer: Self-pay | Admitting: Family

## 2018-04-27 ENCOUNTER — Encounter: Payer: Self-pay | Admitting: Family

## 2018-04-27 ENCOUNTER — Ambulatory Visit (HOSPITAL_BASED_OUTPATIENT_CLINIC_OR_DEPARTMENT_OTHER)
Admission: RE | Admit: 2018-04-27 | Discharge: 2018-04-27 | Disposition: A | Discharge status: Home / Self Care | Payer: BLUE CROSS/BLUE SHIELD | Source: Ambulatory Visit | Attending: Family | Admitting: Family

## 2018-04-27 DIAGNOSIS — M7989 Other specified soft tissue disorders: Secondary | ICD-10-CM | POA: Insufficient documentation

## 2018-04-27 DIAGNOSIS — R9389 Abnormal findings on diagnostic imaging of other specified body structures: Secondary | ICD-10-CM

## 2018-04-27 DIAGNOSIS — M79672 Pain in left foot: Secondary | ICD-10-CM | POA: Insufficient documentation

## 2018-04-27 DIAGNOSIS — Z23 Encounter for immunization: Secondary | ICD-10-CM

## 2018-04-27 DIAGNOSIS — Z0184 Encounter for antibody response examination: Secondary | ICD-10-CM

## 2018-04-27 DIAGNOSIS — T148XXA Other injury of unspecified body region, initial encounter: Secondary | ICD-10-CM

## 2018-04-27 DIAGNOSIS — Z111 Encounter for screening for respiratory tuberculosis: Secondary | ICD-10-CM | POA: Diagnosis not present

## 2018-04-27 LAB — URIC ACID: URIC ACID, SERUM: 3.3 mg/dL (ref 2.4–7.0)

## 2018-04-27 MED ORDER — MELOXICAM 7.5 MG PO TABS: 7.5000 mg | ORAL_TABLET | Freq: Every day | ORAL | 0 refills | Status: DC

## 2018-04-27 NOTE — Progress Notes (Signed)
Subjective:    Patient ID: Taylor Ferguson, female    DOB: Dec 01, 1980, 37 y.o.   MRN: [id number]  HPI  Worked at deaf/blind camp a few weeks back.  Reports that she does not recall any injury. Left foot began to swell. + pain with standing and especially when she turns her foot laterally.    She is also requesting varicella, MMR titer and tb gold testing for her work.   Review of Systems    see HPI  Past Medical History:  Diagnosis Date  . Anemia    history of  . Depression   . Heart murmur   . Heart palpitations   . History of chicken pox   . Hypertension 03/12/2017  . S/P tubal ligation (9/18) 04/15/2015  . Waardenburg syndrome      Social History   Socioeconomic History  . Marital status: Divorced    Spouse name: Not on file  . Number of children: 2  . Years of education: Not on file  . Highest education level: Not on file  Occupational History  . Occupation: homemaker  Social Needs  . Financial resource strain: Not on file  . Food insecurity:    Worry: Not on file    Inability: Not on file  . Transportation needs:    Medical: Not on file    Non-medical: Not on file  Tobacco Use  . Smoking status: Never Smoker  . Smokeless tobacco: Never Used  Substance and Sexual Activity  . Alcohol use: Yes    Alcohol/week: 2.0 standard drinks    Types: 1 Glasses of wine, 1 Cans of beer per week  . Drug use: No  . Sexual activity: Not on file  Lifestyle  . Physical activity:    Days per week: Not on file    Minutes per session: Not on file  . Stress: Not on file  Relationships  . Social connections:    Talks on phone: Not on file    Gets together: Not on file    Attends religious service: Not on file    Active member of club or organization: Not on file    Attends meetings of clubs or organizations: Not on file    Relationship status: Not on file  . Intimate partner violence:    Fear of current or ex partner: Not on file    Emotionally abused: Not on file    Physically abused: Not on file    Forced sexual activity: Not on file  Other Topics Concern  . 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 EYE SURGERY     bilateral -- Waardenburg Syndrome  . TUBAL LIGATION Bilateral 04/14/2015   Procedure: POST PARTUM TUBAL LIGATION;  Surgeon: Azucena Fallen, MD;  Location: [facility];  Service: Gynecology;  Laterality: Bilateral;  . WISDOM TOOTH EXTRACTION      Family History  Problem Relation Age of Onset  . Diabetes Mother   . Hearing loss Son        deaf  . Stroke Maternal Grandmother   . CAD Maternal Grandfather 92  . Heart attack Maternal Grandfather   . CAD Paternal Grandfather 90  . Breast cancer Neg  Hx     Allergies  Allergen Reactions  . Cinnamon Swelling    Red face    Current Outpatient Medications on File Prior to Visit  Medication Sig Dispense Refill  . amLODipine (NORVASC) 10 MG tablet Take 1 tablet (10 mg total) by mouth daily. (Patient not taking: Reported on 11/04/2017) 90 tablet 0   No current facility-administered medications on file prior to visit.     BP 134/85 (BP Location: Left Arm, Patient Position: Sitting, Cuff Size: Small)   Pulse 85   Temp 97.9 F (36.6 C) (Oral)   Resp 16   Ht _0  (1.676 m)   Wt 156 lb (70.8 kg)   LMP 03/27/2018   SpO2 100%   BMI 25.18 kg/m    Objective:   Physical Exam  Constitutional: She appears well-developed and well-nourished.  Cardiovascular: Normal rate, regular rhythm and normal heart sounds.  No murmur heard. Pulmonary/Chest: Effort normal and breath sounds normal. No respiratory distress. She has no wheezes.  Musculoskeletal:  + swelling of left dorsal foot, + tenderness to palpation of dorsum  Psychiatric: She has a normal mood and  affect. Her behavior is normal. Judgment and thought content normal.          Assessment & Plan:  Left foot pain- check uric acid to screen for gout. Obtain x-ray. Trial of meloxicam, follow up if symptoms worsen or fail to improve.  Immunity status- check varicella/mmr titer.  Flu shot today.   TB screening- check TB gold.

## 2018-04-27 NOTE — Telephone Encounter (Signed)
Taylor Ferguson, radiology assistant at [facility] called with xray results; Left foot xray 04/27/18; periosteal reaction along the medial margin of the 3rd metatarsal bone mid to distal diaphyseal, appears somewhat irregular on the oblique view; may represent sequela of stress fracture; read by Dr Enriqueta Shutter; results repeated for verification; see imaging report dated 04/27/18; spoke with Raquel Sarna at [facility] regarding results.

## 2018-04-27 NOTE — Patient Instructions (Signed)
Please complete lab work prior to leaving. Complete foot xray on the first floor in imaging. Begin meloxicam for pain/swelling. Let me know if symptoms worsen or if not improved in 2 weeks.

## 2018-04-27 NOTE — Addendum Note (Signed)
Addended by: Debbrah Alar on: 04/27/2018 03:33 PM   Modules accepted: Orders

## 2018-04-29 ENCOUNTER — Encounter: Payer: Self-pay | Admitting: Family

## 2018-04-29 LAB — QUANTIFERON-TB GOLD PLUS
NIL: 0.02 IU/mL
QuantiFERON-TB Gold Plus: NEGATIVE
TB1-NIL: 0 IU/mL
TB2-NIL: <0 IU/mL

## 2018-04-29 LAB — MEASLES/MUMPS/RUBELLA IMMUNITY
MUMPS IGG: 10.3 [AU]/ml — AB
Rubella: <0.9 index — ABNORMAL LOW
Rubeola IgG: 71.9 AU/mL

## 2018-04-29 LAB — VARICELLA ZOSTER ANTIBODY, IGG: Varicella IgG: 1558 index

## 2018-05-03 ENCOUNTER — Telehealth: Payer: Self-pay | Admitting: Family

## 2018-05-03 ENCOUNTER — Ambulatory Visit: Payer: BLUE CROSS/BLUE SHIELD | Admitting: Family Medicine

## 2018-05-03 ENCOUNTER — Encounter: Payer: Self-pay | Admitting: Family Medicine

## 2018-05-03 VITALS — BP 151/104 | HR 95 | Ht 66.0 in | Wt 156.0 lb

## 2018-05-03 DIAGNOSIS — M79672 Pain in left foot: Secondary | ICD-10-CM

## 2018-05-03 DIAGNOSIS — M19079 Primary osteoarthritis, unspecified ankle and foot: Secondary | ICD-10-CM

## 2018-05-03 NOTE — Patient Instructions (Addendum)
Get the MRI as scheduled to make sure this is nothing more serious. Wear boot when up and walking around. Icing 15 minutes at a time 3-4 times a day. Meloxicam daily with food. Ok to use salon pas patches, topical capsaicin or aspercreme too. Follow up will depend on the MRI. If this is the unusual appearance of a stress fracture this will take 3 more weeks to heal.

## 2018-05-03 NOTE — Telephone Encounter (Signed)
precert done, sent to imaging

## 2018-05-03 NOTE — Telephone Encounter (Signed)
Gwen, I reordered with and without contrast, do we need a new approval?

## 2018-05-03 NOTE — Telephone Encounter (Signed)
-----   Message from Dene Gentry, MD sent at 05/03/2018  4:04 PM EDT ----- Regarding: mri Hey Lenna Sciara -   I just saw [place] for her foot pain.  I know she's concerned about cost but I think she needs the MRI - it looks like it's only in there without contrast though - did you order it with contrast too?  So it would be unusual in a 77-something year old to have a soft tissue mass or bony malignancy.  But the appearance on x-ray and ultrasound are also unusual - the bony cortex looks completely smooth though bumped up on ultrasound - it usually takes 6 weeks from the onset of a stress fracture to do that (she's only been hurting 3 weeks).  There's also a lot of soft tissue with a lot of vascularity superficial to the bone - with a stress fracture it's usually just fluid on top of the bony cortex.  So I would go ahead with an MRI with and without contrast of her foot.  We put her in a boot and discussed some other things she can do for the pain.  Audelia Acton

## 2018-05-03 NOTE — Addendum Note (Signed)
Addended by: Debbrah Alar on: 05/03/2018 10:56 AM   Modules accepted: Orders

## 2018-05-04 ENCOUNTER — Encounter: Payer: Self-pay | Admitting: Family Medicine

## 2018-05-04 ENCOUNTER — Telehealth: Payer: Self-pay | Admitting: Family

## 2018-05-04 ENCOUNTER — Other Ambulatory Visit: Payer: Self-pay

## 2018-05-04 ENCOUNTER — Encounter: Payer: Self-pay | Admitting: Family

## 2018-05-04 DIAGNOSIS — M79672 Pain in left foot: Secondary | ICD-10-CM

## 2018-05-04 NOTE — Telephone Encounter (Signed)
Please contact patient to arrange a follow-up basic metabolic panel in the lab.  This will need to be completed prior to her MRI on Saturday.  Diagnosis is foot pain.

## 2018-05-04 NOTE — Telephone Encounter (Signed)
Called but patient did not answer, left detailed message for her to contact office back for lab appointment preferably tomorrow. Order for test entered.

## 2018-05-04 NOTE — Progress Notes (Signed)
PCP and consultation requested by: Taylor Alar, NP  Subjective:   HPI: Patient is a 37 y.o. female here for left foot pain.  Patient reports about 3 weeks ago she developed pain dorsal left foot with associated swelling. Pain level at 4/10 but up to 8/10 at worst, sharp and has been worsening since. Has been icing, taking meloxicam. Does not exercise regularly but had been at camp walking a lot. Elevating some. No skin changes, numbness.  Past Medical History:  Diagnosis Date  . Anemia    history of  . Depression   . Heart murmur   . Heart palpitations   . History of chicken pox   . Hypertension 03/12/2017  . S/P tubal ligation (9/18) 04/15/2015  . Waardenburg syndrome     Current Outpatient Medications on File Prior to Visit  Medication Sig Dispense Refill  . amLODipine (NORVASC) 10 MG tablet Take 1 tablet (10 mg total) by mouth daily. (Patient not taking: Reported on 11/04/2017) 90 tablet 0  . meloxicam (MOBIC) 7.5 MG tablet Take 1 tablet (7.5 mg total) by mouth daily. 14 tablet 0   No current facility-administered medications on file prior to visit.     Past Surgical History:  Procedure Laterality Date  . EYE SURGERY     bilateral -- Waardenburg Syndrome  . TUBAL LIGATION Bilateral 04/14/2015   Procedure: POST PARTUM TUBAL LIGATION;  Surgeon: Azucena Fallen, MD;  Location: [facility];  Service: Gynecology;  Laterality: Bilateral;  . WISDOM TOOTH EXTRACTION      Allergies  Allergen Reactions  . Cinnamon Swelling    Red face    Social History   Socioeconomic History  . Marital status: Divorced    Spouse name: Not on file  . Number of children: 2  . Years of education: Not on file  . Highest education level: Not on file  Occupational History  . Occupation: homemaker  Social Needs  . Financial resource strain: Not on file  . Food insecurity:    Worry: Not on file    Inability: Not on file  . Transportation needs:    Medical: Not on file    Non-medical:  Not on file  Tobacco Use  . Smoking status: Never Smoker  . Smokeless tobacco: Never Used  Substance and Sexual Activity  . Alcohol use: Yes    Alcohol/week: 2.0 standard drinks    Types: 1 Glasses of wine, 1 Cans of beer per week  . Drug use: No  . Sexual activity: Not on file  Lifestyle  . Physical activity:    Days per week: Not on file    Minutes per session: Not on file  . Stress: Not on file  Relationships  . Social connections:    Talks on phone: Not on file    Gets together: Not on file    Attends religious service: Not on file    Active member of club or organization: Not on file    Attends meetings of clubs or organizations: Not on file    Relationship status: Not on file  . Intimate partner violence:    Fear of current or ex partner: Not on file    Emotionally abused: Not on file    Physically abused: Not on file    Forced sexual activity: Not on file  Other Topics Concern  . 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 Diabetes Mother   . Hearing loss Son        deaf  . Stroke Maternal Grandmother   . CAD Maternal Grandfather 13  . Heart attack Maternal Grandfather   . CAD Paternal Grandfather 56  . Breast cancer Neg Hx     BP (!) 151/104   Pulse 95   Ht 5\' 6"  (1.676 m)   Wt 156 lb (70.8 kg)   LMP 04/24/2018   BMI 25.18 kg/m   Review of Systems: See HPI above.     Objective:  Physical Exam:  Gen: NAD, comfortable in exam room  Left foot/ankle: Mod swelling dorsal foot.  No bruising, other deformity FROM ankle with 5/5 strength, no pain with motions TTP 2nd-4th metatarsals.  No other tenderness of foot or ankle. Negative ant drawer and talar tilt.   Negative syndesmotic compression. Mild pain metatarsal  squeeze. Thompsons test negative. NV intact distally.  Right foot/ankle: No deformity. FROM with 5/5 strength. No tenderness to palpation. NVI distally.   MSK u/s left foot:  Soft tissue swelling noted.  Extensor tendons are intact.  Increased soft tissue with increased vascularity overlying the 3rd metatarsal.  Increase in bone mid 3rd metatarsal but without defect visualized in cortex.  No irregularities of 2nd, 4th metatarsals.  Assessment & Plan:  1. Left foot pain - independently reviewed radiographs; performed and reviewed ultrasound today as well.  Unusual appearance of bone formation of the 3rd metatarsal.  She's only had 3 weeks of pain, pain is worsening as well.  This level of callus on ultrasound (if due to stress fracture) would be unusual only 3 weeks out along with the soft tissue with vascularity superficial to this.  Will go ahead with MRI with and without contrast to further assess.  Cam walker with icing, meloxicam.  Also discussed topical medications she can use.  Follow up will depend on the MRI.

## 2018-05-05 ENCOUNTER — Encounter: Payer: Self-pay | Admitting: Family Medicine

## 2018-05-06 ENCOUNTER — Other Ambulatory Visit: Payer: Self-pay

## 2018-05-06 ENCOUNTER — Other Ambulatory Visit (INDEPENDENT_AMBULATORY_CARE_PROVIDER_SITE_OTHER): Payer: BLUE CROSS/BLUE SHIELD

## 2018-05-06 DIAGNOSIS — M79672 Pain in left foot: Secondary | ICD-10-CM

## 2018-05-06 LAB — BASIC METABOLIC PANEL
BUN: 21 mg/dL (ref 6–23)
CO2: 26 mEq/L (ref 19–32)
CREATININE: 0.83 mg/dL (ref 0.40–1.20)
Calcium: 8.9 mg/dL (ref 8.4–10.5)
Chloride: 106 mEq/L (ref 96–112)
GFR: 82.46 mL/min (ref >60.00)
Glucose, Bld: 98 mg/dL (ref 70–99)
Potassium: 4 mEq/L (ref 3.5–5.1)
Sodium: 139 mEq/L (ref 135–145)

## 2018-05-07 ENCOUNTER — Ambulatory Visit (HOSPITAL_BASED_OUTPATIENT_CLINIC_OR_DEPARTMENT_OTHER)
Admission: RE | Admit: 2018-05-07 | Discharge: 2018-05-07 | Disposition: A | Discharge status: Home / Self Care | Payer: BLUE CROSS/BLUE SHIELD | Source: Ambulatory Visit | Attending: Family | Admitting: Family

## 2018-05-07 ENCOUNTER — Other Ambulatory Visit (HOSPITAL_BASED_OUTPATIENT_CLINIC_OR_DEPARTMENT_OTHER): Payer: BLUE CROSS/BLUE SHIELD

## 2018-05-07 DIAGNOSIS — X58XXXA Exposure to other specified factors, initial encounter: Secondary | ICD-10-CM | POA: Diagnosis not present

## 2018-05-07 DIAGNOSIS — M19079 Primary osteoarthritis, unspecified ankle and foot: Secondary | ICD-10-CM | POA: Diagnosis not present

## 2018-05-07 DIAGNOSIS — M84375A Stress fracture, left foot, initial encounter for fracture: Secondary | ICD-10-CM | POA: Diagnosis not present

## 2018-05-07 MED ORDER — GADOBENATE DIMEGLUMINE 529 MG/ML IV SOLN
15.0000 mL | Freq: Once | INTRAVENOUS | Status: AC | PRN
  Administered 2018-05-07: 15 mL via INTRAVENOUS

## 2018-05-08 ENCOUNTER — Encounter: Payer: Self-pay | Admitting: Family

## 2018-05-09 ENCOUNTER — Encounter: Payer: Self-pay | Admitting: Family Medicine

## 2018-05-09 DIAGNOSIS — F438 Other reactions to severe stress: Secondary | ICD-10-CM | POA: Diagnosis not present

## 2018-06-02 DIAGNOSIS — F438 Other reactions to severe stress: Secondary | ICD-10-CM | POA: Diagnosis not present

## 2018-06-16 DIAGNOSIS — F438 Other reactions to severe stress: Secondary | ICD-10-CM | POA: Diagnosis not present

## 2018-07-25 DIAGNOSIS — F438 Other reactions to severe stress: Secondary | ICD-10-CM | POA: Diagnosis not present

## 2018-07-28 DIAGNOSIS — F438 Other reactions to severe stress: Secondary | ICD-10-CM | POA: Diagnosis not present

## 2018-08-04 ENCOUNTER — Encounter: Payer: Self-pay | Admitting: Family

## 2018-08-04 DIAGNOSIS — F438 Other reactions to severe stress: Secondary | ICD-10-CM | POA: Diagnosis not present

## 2018-08-09 ENCOUNTER — Ambulatory Visit: Payer: BLUE CROSS/BLUE SHIELD | Admitting: Family

## 2018-08-09 ENCOUNTER — Encounter: Payer: Self-pay | Admitting: Family

## 2018-08-09 VITALS — BP 143/84 | HR 82 | Temp 98.6°F | Resp 16 | Ht 66.0 in | Wt 157.0 lb

## 2018-08-09 DIAGNOSIS — F418 Other specified anxiety disorders: Secondary | ICD-10-CM | POA: Diagnosis not present

## 2018-08-09 MED ORDER — VENLAFAXINE HCL 37.5 MG PO TABS: ORAL_TABLET | ORAL | 0 refills | Status: DC
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
## 2018-08-09 NOTE — Patient Instructions (Signed)
Please begin Effexor 1 tab once daily for 3 days then increase to two tabs once daily on day 4.  Continue your work with Higher education careers adviser.

## 2018-08-09 NOTE — Addendum Note (Signed)
Addended by: Debbrah Alar on: 08/09/2018 10:21 AM   Modules accepted: Level of Service

## 2018-08-25 DIAGNOSIS — F438 Other reactions to severe stress: Secondary | ICD-10-CM | POA: Diagnosis not present

## 2018-09-07 ENCOUNTER — Encounter: Payer: Self-pay | Admitting: Family

## 2018-09-07 ENCOUNTER — Ambulatory Visit: Payer: BLUE CROSS/BLUE SHIELD | Admitting: Family

## 2018-09-07 VITALS — BP 132/85 | HR 80 | Temp 98.4°F | Resp 16 | Ht 66.0 in | Wt 157.0 lb

## 2018-09-07 DIAGNOSIS — R142 Eructation: Secondary | ICD-10-CM

## 2018-09-07 DIAGNOSIS — R030 Elevated blood-pressure reading, without diagnosis of hypertension: Secondary | ICD-10-CM

## 2018-09-07 DIAGNOSIS — F418 Other specified anxiety disorders: Secondary | ICD-10-CM

## 2018-09-07 MED ORDER — VENLAFAXINE HCL ER 150 MG PO CP24: 150.0000 mg | ORAL_CAPSULE | Freq: Every day | ORAL | 1 refills | Status: DC
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
## 2018-09-07 NOTE — Patient Instructions (Signed)
Please complete lab work prior to leaving.   

## 2018-10-06 ENCOUNTER — Ambulatory Visit: Payer: Self-pay

## 2018-10-06 NOTE — Telephone Encounter (Signed)
rec'd call from pt. Via Copy;  Reported onset of sore throat 2 days ago.  Stated yesterday, sore throat subsided, and sx's of runny nose, chills and infrequent cough began.  Also, c/o mild neck ache, and fatigue.  Reported she flew from [place], [place] on 09/28/18, and landed in [facility].  Voiced concern of poss. Exposure to COVID 19 due to air travel and landing at [employer].  Appt. Scheduled with PCP 3/13 AM.  Care advice with precautions to isolate herself, until further evaluated.  Pt. Verb. Understanding.  Stated her 3 yr. Old son is also coughing.  Advised will need to Triage him separately.  Pt. Stated she will continue to monitor him, for worsening symptoms, and will call back if needed.  Pt. Verb. Understanding of instructions.       Reason for Disposition . [1] Fever or feeling feverish AND [2] within 14 Days of CORONAVIRUS EXPOSURE  Answer Assessment - Initial Assessment Questions 1. PLACE of CONTACT: "Where were you when you were exposed to coronavirus?" (e.g., city, state, country)     Does not know if she was exposed; flew back from [place], [place] 2. TYPE of CONTACT: "How much contact was there?" (e.g., live in same house, work in same office, same school)     In airplane  3. DATE of CONTACT: "When did you have contact with a coronavirus patient?" (e.g., days)    09/28/18 4. SYMPTOMS: "Do you have any symptoms?" (e.g., fever, cough, breathing difficulty)     C/o chills, runny nose, infreq. Cough; onset of sore throat 2 days ago; now sore throat gone, but runny nose. Denied shortness of breath  5. PREGNANCY OR POSTPARTUM: "Is there any chance you are pregnant?" "When was your last menstrual period?" "Did you deliver in the last 2 weeks?"    LMP 09/26/18; does not feel she could be pregnant.  6. HIGH RISK: "Do you have any heart or lung problems? Do you have a weakened immune system?" (e.g., CHF, COPD, asthma, HIV positive, chemotherapy,  renal failure, diabetes mellitus, sickle cell anemia)    Stated she is very healthy  Protocols used: CORONAVIRUS (2019-NCOV) EXPOSURE-A-AH

## 2018-10-06 NOTE — Telephone Encounter (Signed)
Call [facility] to make aware of pt's complaints, and the appt. That was scheduled for 7:00 AM, 3/13.  Advised per [facility], to call pt. Back, advise her to stay home, and call the Health Dept., for her county, for further recommendations.  Also advise if becomes short of breath, to go to the ER.  Phone call to pt.  Via sign language interpreter, advised that the [facility] recommended to cancel tomorrow AM appt.  Advised to call [place] Dept. @ [phone], to report poss. exposure to COVID 19, and follow further recommendations by Health Dept.  Also advised to go to ER if becomes short of breath.  Verb. Understanding and agreed with plan.

## 2018-10-07 ENCOUNTER — Ambulatory Visit: Payer: Self-pay | Admitting: *Deleted

## 2018-10-07 ENCOUNTER — Ambulatory Visit: Payer: BLUE CROSS/BLUE SHIELD | Admitting: Family

## 2018-10-07 NOTE — Telephone Encounter (Signed)
Patient is calling to report that she was told by health dept to call local PCP for [facility] test. Call to PCP- they are out of test.  Patient notified PCP is out of test- please let Health Dept know that her PCP is out of test. Patient will call them and let them know.

## 2018-10-18 ENCOUNTER — Encounter: Payer: Self-pay | Admitting: Family

## 2018-10-18 ENCOUNTER — Other Ambulatory Visit: Payer: Self-pay

## 2018-10-18 ENCOUNTER — Ambulatory Visit: Payer: BLUE CROSS/BLUE SHIELD | Admitting: Family

## 2018-10-18 VITALS — BP 120/84 | HR 73 | Temp 98.4°F | Resp 16 | Ht 65.0 in | Wt 159.0 lb

## 2018-10-18 DIAGNOSIS — F329 Major depressive disorder, single episode, unspecified: Secondary | ICD-10-CM | POA: Diagnosis not present

## 2018-10-18 DIAGNOSIS — F32A Depression, unspecified: Secondary | ICD-10-CM

## 2018-10-18 MED ORDER — BUPROPION HCL ER (XL) 150 MG PO TB24: 150.0000 mg | ORAL_TABLET | Freq: Every day | ORAL | 1 refills | Status: DC

## 2018-10-18 NOTE — Progress Notes (Signed)
Subjective:    Patient ID: Taylor Ferguson, female    DOB: 18-May-1981, 38 y.o.   MRN: [id number]  HPI  Patient is a 38 yr old female who presents today for follow up.    Depression/anxiety- last visit we increased effexor to 250mg  once daily.  Reports that she stopped effexor about 3 weeks. Felt withdrawn on the effexor.  Since she stopped the effexor she is more talkative with others. Concerned that she has had some weight gain.  Wt Readings from Last 3 Encounters:  10/18/18 159 lb (72.1 kg)  09/07/18 157 lb (71.2 kg)  08/09/18 157 lb (71.2 kg)     Review of Systems Past Medical History:  Diagnosis Date  . Anemia    history of  . Depression   . Heart murmur   . Heart palpitations   . History of chicken pox   . Hypertension 03/12/2017  . S/P tubal ligation (9/18) 04/15/2015  . Waardenburg syndrome      Social History   Socioeconomic History  . Marital status: Legally Separated    Spouse name: Not on file  . Number of children: 2  . Years of education: Not on file  . Highest education level: Not on file  Occupational History  . Occupation: homemaker  Social Needs  . Financial resource strain: Not on file  . Food insecurity:    Worry: Not on file    Inability: Not on file  . Transportation needs:    Medical: Not on file    Non-medical: Not on file  Tobacco Use  . Smoking status: Never Smoker  . Smokeless tobacco: Never Used  Substance and Sexual Activity  . Alcohol use: Yes    Alcohol/week: 2.0 standard drinks    Types: 1 Glasses of wine, 1 Cans of beer per week  . Drug use: No  . Sexual activity: Not on file  Lifestyle  . Physical activity:    Days per week: Not on file    Minutes per session: Not on file  . Stress: Not on file  Relationships  . Social connections:    Talks on phone: Not on file    Gets together: Not on file    Attends religious service: Not on file    Active member of club or organization: Not on file    Attends meetings of clubs  or organizations: Not on file    Relationship status: Not on file  . Intimate partner violence:    Fear of current or ex partner: Not on file    Emotionally abused: Not on file    Physically abused: Not on file    Forced sexual activity: Not on file  Other Topics Concern  . 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 EYE SURGERY     bilateral -- Waardenburg Syndrome  . TUBAL LIGATION Bilateral 04/14/2015   Procedure: POST PARTUM TUBAL LIGATION;  Surgeon: Azucena Fallen, MD;  Location: [facility];  Service: Gynecology;  Laterality: Bilateral;  . WISDOM TOOTH EXTRACTION      Family History  Problem Relation Age of Onset  . Diabetes Mother   . Hearing loss Son  deaf  . Stroke Maternal Grandmother   . CAD Maternal Grandfather 57  . Heart attack Maternal Grandfather   . CAD Paternal Grandfather 62  . Breast cancer Neg Hx     Allergies  Allergen Reactions  . Cinnamon Swelling    Red face    No current outpatient medications on file prior to visit.   No current facility-administered medications on file prior to visit.     BP (!) 129/94 (BP Location: Right Arm, Patient Position: Sitting, Cuff Size: Small)   Pulse 73   Temp 98.4 F (36.9 C) (Oral)   Resp 16   Ht 5\' 5"  (1.651 m)   Wt 159 lb (72.1 kg)   SpO2 100%   BMI 26.46 kg/m       Objective:   Physical Exam Constitutional:      General: She is not in acute distress.    Appearance: She is well-developed.  HENT:     Head: Normocephalic and atraumatic.  Neurological:     Mental Status: She is alert and oriented to person, place, and time.  Psychiatric:        Behavior: Behavior normal.        Thought Content: Thought content normal.        Judgment: Judgment normal.            Assessment & Plan:  A sign language interpreter is utilized using our video program- Minette Brine [phone].  Depression- uncontrolled. Concerned about recent weight gain.  Will give trial of wellbutrin xl 150mg  once daily.    A total of 15 minutes were spent face-to-face with the patient during this encounter and over half of that time was spent on counseling and coordination of care. The patient was counseled on depression treatment.

## 2018-10-18 NOTE — Patient Instructions (Signed)
Please begin wellbutrin xl 150mg  once daily.

## 2018-12-13 ENCOUNTER — Ambulatory Visit: Payer: BLUE CROSS/BLUE SHIELD | Admitting: Family

## 2018-12-22 ENCOUNTER — Encounter: Payer: Self-pay | Admitting: Family

## 2018-12-22 MED ORDER — BUPROPION HCL ER (XL) 150 MG PO TB24: 150.0000 mg | ORAL_TABLET | Freq: Every day | ORAL | 3 refills | Status: DC

## 2019-01-03 ENCOUNTER — Telehealth: Payer: Self-pay | Admitting: Family

## 2019-01-03 NOTE — Telephone Encounter (Signed)
LVM for patient to call back to schedule virtual visit for hair loss per my chart message.

## 2019-01-17 ENCOUNTER — Encounter: Payer: Self-pay | Admitting: Family

## 2019-01-17 ENCOUNTER — Other Ambulatory Visit: Payer: Self-pay

## 2019-01-17 ENCOUNTER — Ambulatory Visit: Payer: BC Managed Care – PPO | Admitting: Family

## 2019-01-17 VITALS — BP 135/90 | HR 82 | Temp 99.2°F | Resp 16 | Wt 142.0 lb

## 2019-01-17 DIAGNOSIS — F329 Major depressive disorder, single episode, unspecified: Secondary | ICD-10-CM | POA: Diagnosis not present

## 2019-01-17 DIAGNOSIS — L659 Nonscarring hair loss, unspecified: Secondary | ICD-10-CM | POA: Diagnosis not present

## 2019-01-17 DIAGNOSIS — F32A Depression, unspecified: Secondary | ICD-10-CM

## 2019-01-17 MED ORDER — BUPROPION HCL ER (XL) 150 MG PO TB24: 150.0000 mg | ORAL_TABLET | Freq: Every day | ORAL | 0 refills | Status: DC

## 2019-01-17 NOTE — Progress Notes (Signed)
Subjective:    Patient ID: Taylor Ferguson, female    DOB: 22-Jul-1981, 38 y.o.   MRN: [id number]  HPI   A sign language interpreter is present for today's visit.  Patient is a 38 yr old female who presents today to discuss alopecia. Reports hair loss began 1 month ago. She reports tha there family noticed it first.  Reports that she got a hair cut last week and her stylist was very concerned about her hair loss down the center.  Notices increased hair loss in the middle. She reports that her mother's hair is "very fine".    Depression- last visit we gave a trial of wellbutrin. She reports increased energy, increased motivation.  She reports that she has been trying to lose weight and has been drinking more water. She reports that she stopped seeing her therapist about 1 month ago- as she feels that she is doing better.  Wt Readings from Last 3 Encounters:  01/17/19 142 lb (64.4 kg)  10/18/18 159 lb (72.1 kg)  09/07/18 157 lb (71.2 kg)    BP Readings from Last 3 Encounters:  01/17/19 135/90  10/18/18 120/84  09/07/18 132/85    Review of Systems    see HPI  Past Medical History:  Diagnosis Date  . Anemia    history of  . Depression   . Heart murmur   . Heart palpitations   . History of chicken pox   . Hypertension 03/12/2017  . S/P tubal ligation (9/18) 04/15/2015  . Waardenburg syndrome      Social History   Socioeconomic History  . Marital status: Legally Separated    Spouse name: Not on file  . Number of children: 2  . Years of education: Not on file  . Highest education level: Not on file  Occupational History  . Occupation: homemaker  Social Needs  . Financial resource strain: Not on file  . Food insecurity    Worry: Not on file    Inability: Not on file  . Transportation needs    Medical: Not on file    Non-medical: Not on file  Tobacco Use  . Smoking status: Never Smoker  . Smokeless tobacco: Never Used  Substance and Sexual Activity  . Alcohol  use: Yes    Alcohol/week: 2.0 standard drinks    Types: 1 Glasses of wine, 1 Cans of beer per week  . Drug use: No  . Sexual activity: Not on file  Lifestyle  . Physical activity    Days per week: Not on file    Minutes per session: Not on file  . Stress: Not on file  Relationships  . Social Herbalist on phone: Not on file    Gets together: Not on file    Attends religious service: Not on file    Active member of club or organization: Not on file    Attends meetings of clubs or organizations: Not on file    Relationship status: Not on file  . Intimate partner violence    Fear of current or ex partner: Not on file    Emotionally abused: Not on file    Physically abused: Not on file    Forced sexual activity: Not on file  Other Topics Concern  . 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 EYE SURGERY     bilateral -- Waardenburg Syndrome  . TUBAL LIGATION Bilateral 04/14/2015   Procedure: POST PARTUM TUBAL LIGATION;  Surgeon: Azucena Fallen, MD;  Location: [facility];  Service: Gynecology;  Laterality: Bilateral;  . WISDOM TOOTH EXTRACTION      Family History  Problem Relation Age of Onset  . Diabetes Mother   . Hearing loss Son        deaf  . Stroke Maternal Grandmother   . CAD Maternal Grandfather 58  . Heart attack Maternal Grandfather   . CAD Paternal Grandfather 71  . Breast cancer Neg Hx     Allergies  Allergen Reactions  . Cinnamon Swelling    Red face    No current outpatient medications on file prior to visit.   No current facility-administered medications on file prior to visit.     BP 135/90   Pulse 82   Temp 99.2 F (37.3 C) (Oral)   Resp 16   Wt 142 lb (64.4 kg)   SpO2 100%   BMI 23.63 kg/m     Objective:   Physical Exam Constitutional:      Appearance: She is well-developed.  Neck:     Musculoskeletal: Neck supple.     Thyroid: No thyromegaly.  Cardiovascular:     Rate and Rhythm: Normal rate and regular rhythm.     Heart sounds: Normal heart sounds. No murmur.  Pulmonary:     Effort: Pulmonary effort is normal. No respiratory distress.     Breath sounds: Normal breath sounds. No wheezing.  Skin:    General: Skin is warm and dry.     Comments: Thinning hair noted on scalp  Neurological:     Mental Status: She is alert and oriented to person, place, and time.  Psychiatric:        Behavior: Behavior normal.        Thought Content: Thought content normal.        Judgment: Judgment normal.           Assessment & Plan:  Hair loss- new. Check TSH, CBC, refer to dermatology.  Depression- much improved on wellbutrin. She has managed to lose some weight as well.  Continue current dose of wellbutrin.

## 2019-01-18 LAB — TSH: TSH: 0.78 u[IU]/mL (ref 0.35–4.50)

## 2019-01-18 LAB — CBC WITH DIFFERENTIAL/PLATELET
Basophils Absolute: 0 10*3/uL (ref 0.0–0.1)
Basophils Relative: 0.4 % (ref 0.0–3.0)
Eosinophils Absolute: 0 10*3/uL (ref 0.0–0.7)
Eosinophils Relative: 0.5 % (ref 0.0–5.0)
HCT: 38.5 % (ref 36.0–46.0)
Hemoglobin: 13 g/dL (ref 12.0–15.0)
Lymphocytes Relative: 15 % (ref 12.0–46.0)
Lymphs Abs: 0.9 10*3/uL (ref 0.7–4.0)
MCHC: 33.8 g/dL (ref 30.0–36.0)
MCV: 91.3 fl (ref 78.0–100.0)
Monocytes Absolute: 0.7 10*3/uL (ref 0.1–1.0)
Monocytes Relative: 11.2 % (ref 3.0–12.0)
Neutro Abs: 4.6 10*3/uL (ref 1.4–7.7)
Neutrophils Relative %: 72.9 % (ref 43.0–77.0)
Platelets: 259 10*3/uL (ref 150.0–400.0)
RBC: 4.22 Mil/uL (ref 3.87–5.11)
RDW: 12.8 % (ref 11.5–15.5)
WBC: 6.2 10*3/uL (ref 4.0–10.5)

## 2019-02-14 ENCOUNTER — Ambulatory Visit: Payer: BC Managed Care – PPO | Admitting: Family

## 2019-03-17 ENCOUNTER — Other Ambulatory Visit: Payer: Self-pay

## 2019-03-17 ENCOUNTER — Encounter: Payer: Self-pay | Admitting: Family

## 2019-03-17 ENCOUNTER — Ambulatory Visit: Payer: BC Managed Care – PPO | Admitting: Family

## 2019-03-17 VITALS — BP 132/90 | HR 78 | Temp 97.7°F | Resp 16 | Wt 142.0 lb

## 2019-03-17 DIAGNOSIS — F329 Major depressive disorder, single episode, unspecified: Secondary | ICD-10-CM | POA: Diagnosis not present

## 2019-03-17 DIAGNOSIS — I10 Essential (primary) hypertension: Secondary | ICD-10-CM

## 2019-03-17 DIAGNOSIS — G2581 Restless legs syndrome: Secondary | ICD-10-CM | POA: Diagnosis not present

## 2019-03-17 DIAGNOSIS — L659 Nonscarring hair loss, unspecified: Secondary | ICD-10-CM | POA: Diagnosis not present

## 2019-03-17 DIAGNOSIS — F32A Depression, unspecified: Secondary | ICD-10-CM

## 2019-03-17 MED ORDER — ROPINIROLE HCL 0.25 MG PO TABS: 0.2500 mg | ORAL_TABLET | Freq: Every day | ORAL | 3 refills | Status: AC

## 2019-03-17 NOTE — Progress Notes (Signed)
Subjective:    Patient ID: Taylor Ferguson, female    DOB: [birth year], 38 y.o.   MRN: [id number]  HPI  Patient is a 38 yr old female who presents today for follow up. She is accompanied by a sign language interpretor.   Alopecia- Last visit we checked a cbc and tsh. She was also referred to dermatology. She wasg  Lab Results  Component Value Date   TSH 0.78 01/17/2019   CBC    Component Value Date/Time   WBC 6.2 01/17/2019 1521   RBC 4.22 01/17/2019 1521   HGB 13.0 01/17/2019 1521   HCT 38.5 01/17/2019 1521   PLT 259.0 01/17/2019 1521   MCV 91.3 01/17/2019 1521   MCH 27.4 04/14/2015 0635   MCHC 33.8 01/17/2019 1521   RDW 12.8 01/17/2019 1521   LYMPHSABS 0.9 01/17/2019 1521   MONOABS 0.7 01/17/2019 1521   EOSABS 0.0 01/17/2019 1521   BASOSABS 0.0 01/17/2019 1521   Depression- Reports overall depression is improved. She reports that her father who had been ill for some time died last week. She feels like she is doing OK with this loss.    Reports that she has been having an issue with sleep x 1 month. She reports that she has trouble falling asleep due to RLS.    Review of Systems See HPI  Past Medical History:  Diagnosis Date  . Anemia    history of  . Depression   . Heart murmur   . Heart palpitations   . History of chicken pox   . Hypertension 03/12/2017  . S/P tubal ligation (9/18) 04/15/2015  . Waardenburg syndrome      Social History   Socioeconomic History  . Marital status: Legally Separated    Spouse name: Not on file  . Number of children: 2  . Years of education: Not on file  . Highest education level: Not on file  Occupational History  . Occupation: homemaker  Social Needs  . Financial resource strain: Not on file  . Food insecurity    Worry: Not on file    Inability: Not on file  . Transportation needs    Medical: Not on file    Non-medical: Not on file  Tobacco Use  . Smoking status: Never Smoker  . Smokeless tobacco: Never Used   Substance and Sexual Activity  . Alcohol use: Yes    Alcohol/week: 2.0 standard drinks    Types: 1 Glasses of wine, 1 Cans of beer per week  . Drug use: No  . Sexual activity: Not on file  Lifestyle  . Physical activity    Days per week: Not on file    Minutes per session: Not on file  . Stress: Not on file  Relationships  . Social Herbalist on phone: Not on file    Gets together: Not on file    Attends religious service: Not on file    Active member of club or organization: Not on file    Attends meetings of clubs or organizations: Not on file    Relationship status: Not on file  . Intimate partner violence    Fear of current or ex partner: Not on file    Emotionally abused: Not on file    Physically abused: Not on file    Forced sexual activity: Not on file  Other Topics Concern  . 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 EYE SURGERY     bilateral -- Waardenburg Syndrome  . TUBAL LIGATION Bilateral 04/14/2015   Procedure: POST PARTUM TUBAL LIGATION;  Surgeon: Azucena Fallen, MD;  Location: [facility];  Service: Gynecology;  Laterality: Bilateral;  . WISDOM TOOTH EXTRACTION      Family History  Problem Relation Age of Onset  . Diabetes Mother   . Hearing loss Son        deaf  . Stroke Maternal Grandmother   . CAD Maternal Grandfather 74  . Heart attack Maternal Grandfather   . CAD Paternal Grandfather 31  . Breast cancer Neg Hx     Allergies  Allergen Reactions  . Cinnamon Swelling    Red face    Current Outpatient Medications on File Prior to Visit  Medication Sig Dispense Refill  . buPROPion (WELLBUTRIN XL) 150 MG 24 hr tablet Take 1 tablet (150 mg total) by mouth daily. 90 tablet 0  . ketoconazole (NIZORAL) 2 %  shampoo      No current facility-administered medications on file prior to visit.     BP (!) 140/100 (BP Location: Right Arm, Patient Position: Sitting, Cuff Size: Small)   Pulse 78   Temp 97.7 F (36.5 C) (Temporal)   Resp 16   Wt 142 lb (64.4 kg)   SpO2 99%   BMI 23.63 kg/m       Objective:   Physical Exam Constitutional:      Appearance: She is well-developed.  HENT:     Right Ear: Tympanic membrane and ear canal normal.     Left Ear: Tympanic membrane and ear canal normal.  Neck:     Musculoskeletal: Neck supple.     Thyroid: No thyromegaly.  Cardiovascular:     Rate and Rhythm: Normal rate and regular rhythm.     Heart sounds: Normal heart sounds. No murmur.  Pulmonary:     Effort: Pulmonary effort is normal. No respiratory distress.     Breath sounds: Normal breath sounds. No wheezing.  Skin:    General: Skin is warm and dry.  Neurological:     Mental Status: She is alert and oriented to person, place, and time.  Psychiatric:        Behavior: Behavior normal.        Thought Content: Thought content normal.        Judgment: Judgment normal.           Assessment & Plan:  Depression- stable on wellbutrin. She does not wish to discontinue or change medication at this time.  Hair loss- on ketoconazole shampoo per derm- defer management to dermatology.  RLS- trial of requip.  Elevated blood pressure reading- initial reading high, follow up bp check today was better. We discussed low sodium diet, exercise. Plan to repeat bp in 3 months.

## 2019-03-17 NOTE — Patient Instructions (Signed)
Please begin Requip one hour before bed each night for you restless leg. Work on low sodium diet, exercise.

## 2019-04-19 ENCOUNTER — Encounter: Payer: Self-pay | Admitting: Family

## 2019-04-27 ENCOUNTER — Ambulatory Visit: Payer: BC Managed Care – PPO

## 2019-05-08 ENCOUNTER — Other Ambulatory Visit: Payer: Self-pay

## 2019-05-09 ENCOUNTER — Encounter: Payer: Self-pay | Admitting: Family

## 2019-05-09 ENCOUNTER — Ambulatory Visit (INDEPENDENT_AMBULATORY_CARE_PROVIDER_SITE_OTHER): Payer: BC Managed Care – PPO | Admitting: Family

## 2019-05-09 ENCOUNTER — Other Ambulatory Visit (HOSPITAL_COMMUNITY)
Admission: RE | Admit: 2019-05-09 | Discharge: 2019-05-09 | Disposition: A | Discharge status: Home / Self Care | Payer: BC Managed Care – PPO | Source: Ambulatory Visit | Attending: Family | Admitting: Family

## 2019-05-09 VITALS — BP 120/82 | HR 82 | Temp 97.0°F | Resp 16 | Ht 65.0 in | Wt 148.0 lb

## 2019-05-09 DIAGNOSIS — Z0000 Encounter for general adult medical examination without abnormal findings: Secondary | ICD-10-CM | POA: Diagnosis not present

## 2019-05-09 DIAGNOSIS — Z23 Encounter for immunization: Secondary | ICD-10-CM

## 2019-05-09 DIAGNOSIS — R300 Dysuria: Secondary | ICD-10-CM | POA: Diagnosis not present

## 2019-05-09 DIAGNOSIS — F32A Depression, unspecified: Secondary | ICD-10-CM

## 2019-05-09 DIAGNOSIS — Z01419 Encounter for gynecological examination (general) (routine) without abnormal findings: Secondary | ICD-10-CM

## 2019-05-09 DIAGNOSIS — F329 Major depressive disorder, single episode, unspecified: Secondary | ICD-10-CM | POA: Diagnosis not present

## 2019-05-09 LAB — HEPATIC FUNCTION PANEL
ALT: 12 U/L (ref 0–35)
AST: 11 U/L (ref 0–37)
Albumin: 4.5 g/dL (ref 3.5–5.2)
Alkaline Phosphatase: 49 U/L (ref 39–117)
Bilirubin, Direct: 0.2 mg/dL (ref 0.0–0.3)
Total Bilirubin: 0.9 mg/dL (ref 0.2–1.2)
Total Protein: 6.7 g/dL (ref 6.0–8.3)

## 2019-05-09 LAB — BASIC METABOLIC PANEL
BUN: 16 mg/dL (ref 6–23)
CO2: 27 mEq/L (ref 19–32)
Calcium: 9.3 mg/dL (ref 8.4–10.5)
Chloride: 103 mEq/L (ref 96–112)
Creatinine, Ser: 0.64 mg/dL (ref 0.40–1.20)
GFR: 104.15 mL/min (ref >60.00)
Glucose, Bld: 83 mg/dL (ref 70–99)
Potassium: 3.9 mEq/L (ref 3.5–5.1)
Sodium: 137 mEq/L (ref 135–145)

## 2019-05-09 LAB — CBC WITH DIFFERENTIAL/PLATELET
Basophils Absolute: 0 10*3/uL (ref 0.0–0.1)
Basophils Relative: 0.7 % (ref 0.0–3.0)
Eosinophils Absolute: 0 10*3/uL (ref 0.0–0.7)
Eosinophils Relative: 0.7 % (ref 0.0–5.0)
HCT: 39 % (ref 36.0–46.0)
Hemoglobin: 13.1 g/dL (ref 12.0–15.0)
Lymphocytes Relative: 18.3 % (ref 12.0–46.0)
Lymphs Abs: 1.1 10*3/uL (ref 0.7–4.0)
MCHC: 33.6 g/dL (ref 30.0–36.0)
MCV: 92.4 fl (ref 78.0–100.0)
Monocytes Absolute: 0.7 10*3/uL (ref 0.1–1.0)
Monocytes Relative: 11.1 % (ref 3.0–12.0)
Neutro Abs: 4.3 10*3/uL (ref 1.4–7.7)
Neutrophils Relative %: 69.2 % (ref 43.0–77.0)
Platelets: 299 10*3/uL (ref 150.0–400.0)
RBC: 4.22 Mil/uL (ref 3.87–5.11)
RDW: 12.6 % (ref 11.5–15.5)
WBC: 6.2 10*3/uL (ref 4.0–10.5)

## 2019-05-09 LAB — URINALYSIS, ROUTINE W REFLEX MICROSCOPIC
Bilirubin Urine: NEGATIVE
Hgb urine dipstick: NEGATIVE
Ketones, ur: NEGATIVE
Leukocytes,Ua: NEGATIVE
Nitrite: NEGATIVE
RBC / HPF: NONE SEEN (ref 0–?)
Specific Gravity, Urine: 1.02 (ref 1.000–1.030)
Total Protein, Urine: NEGATIVE
Urine Glucose: NEGATIVE
Urobilinogen, UA: 1 (ref 0.0–1.0)
pH: 7 (ref 5.0–8.0)

## 2019-05-09 LAB — LIPID PANEL
Cholesterol: 199 mg/dL (ref 0–200)
HDL: 79.2 mg/dL (ref >39.00)
LDL Cholesterol: 102 mg/dL — ABNORMAL HIGH (ref 0–99)
NonHDL: 119.73
Total CHOL/HDL Ratio: 3
Triglycerides: 89 mg/dL (ref 0.0–149.0)
VLDL: 17.8 mg/dL (ref 0.0–40.0)

## 2019-05-09 LAB — TSH: TSH: 0.95 u[IU]/mL (ref 0.35–4.50)

## 2019-05-09 MED ORDER — BUPROPION HCL ER (XL) 150 MG PO TB24: 150.0000 mg | ORAL_TABLET | Freq: Every day | ORAL | 1 refills | Status: DC

## 2019-05-09 NOTE — Progress Notes (Signed)
Subjective:    Patient ID: Taylor Ferguson, female    DOB: August 07, 1980, 38 y.o.   MRN: [id number]  HPI  Patient presents today for complete physical.  Immunizations: flu shot today.  Tetanus up to date Diet: reports that here diet is healthy Wt Readings from Last 3 Encounters:  05/09/19 148 lb (67.1 kg)  03/17/19 142 lb (64.4 kg)  01/17/19 142 lb (64.4 kg)  Exercise: not regularly Pap Smear: due Vision: 2 years ago Dental: 10-15 years ago. She does not have dental insurance   Reports +dysuria/frequency x 1 month.  Denies fever.   A sign language interpreter is present today for her visit and a chaperone was present for Pelvic exam.   Depression- ran out of wellbutrin about 2 weeks ago. Energy has been lower.  Reports that her job will be ending in 1 week.   Review of Systems  Constitutional: Negative for unexpected weight change.  HENT: Positive for hearing loss. Negative for rhinorrhea.   Eyes: Negative for visual disturbance.  Respiratory: Negative for cough.   Cardiovascular: Negative for leg swelling.  Gastrointestinal: Positive for diarrhea (occasional diarrhea). Negative for constipation.  Genitourinary: Positive for dysuria, frequency and hematuria.  Musculoskeletal: Positive for neck pain (sometimes). Negative for arthralgias and myalgias.  Skin: Negative for rash.  Neurological: Negative for headaches.  Hematological: Negative for adenopathy.   Past Medical History:  Diagnosis Date  . Anemia    history of  . Depression   . Heart murmur   . Heart palpitations   . History of chicken pox   . Hypertension 03/12/2017  . S/P tubal ligation (9/18) 04/15/2015  . Waardenburg syndrome      Social History   Socioeconomic History  . Marital status: Legally Separated    Spouse name: Not on file  . Number of children: 2  . Years of education: Not on file  . Highest education level: Not on file  Occupational History  . Occupation: homemaker  Social Needs  .  Financial resource strain: Not on file  . Food insecurity    Worry: Not on file    Inability: Not on file  . Transportation needs    Medical: Not on file    Non-medical: Not on file  Tobacco Use  . Smoking status: Never Smoker  . Smokeless tobacco: Never Used  Substance and Sexual Activity  . Alcohol use: Yes    Alcohol/week: 2.0 standard drinks    Types: 1 Glasses of wine, 1 Cans of beer per week  . Drug use: No  . Sexual activity: Not on file  Lifestyle  . Physical activity    Days per week: Not on file    Minutes per session: Not on file  . Stress: Not on file  Relationships  . Social Herbalist on phone: Not on file    Gets together: Not on file    Attends religious service: Not on file    Active member of club or organization: Not on file    Attends meetings of clubs or organizations: Not on file    Relationship status: Not on file  . Intimate partner violence    Fear of current or ex partner: Not on file    Emotionally abused: Not on file    Physically abused: Not on file    Forced sexual activity: Not on file  Other Topics Concern  . 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 EYE SURGERY     bilateral -- Waardenburg Syndrome  . TUBAL LIGATION Bilateral 04/14/2015   Procedure: POST PARTUM TUBAL LIGATION;  Surgeon: Azucena Fallen, MD;  Location: [facility];  Service: Gynecology;  Laterality: Bilateral;  . WISDOM TOOTH EXTRACTION      Family History  Problem Relation Age of Onset  . Diabetes Mother   . Hearing loss Son        deaf  . Stroke Maternal Grandmother   . CAD Maternal Grandfather 64  . Heart attack Maternal Grandfather   . CAD Paternal Grandfather 67  . Breast cancer Neg Hx     Allergies  Allergen  Reactions  . Cinnamon Swelling    Red face    Current Outpatient Medications on File Prior to Visit  Medication Sig Dispense Refill  . ketoconazole (NIZORAL) 2 % shampoo     . rOPINIRole (REQUIP) 0.25 MG tablet Take 1 tablet (0.25 mg total) by mouth at bedtime. 30 tablet 3   No current facility-administered medications on file prior to visit.     BP 120/82   Pulse 82   Temp (!) 97 F (36.1 C) (Temporal)   Resp 16   Ht 5\' 5"  (1.651 m)   Wt 148 lb (67.1 kg)   SpO2 100%   BMI 24.63 kg/m       Objective:   Physical Exam  Physical Exam  Constitutional: She is oriented to person, place, and time. She appears well-developed and well-nourished. No distress.  HENT:  Head: Normocephalic and atraumatic.  Right Ear: Tympanic membrane and ear canal normal.  Left Ear: Tympanic membrane and ear canal normal.  Mouth/Throat: Oropharynx is clear and moist.  Eyes: Pupils are equal, round, and reactive to light. No scleral icterus.  Neck: Normal range of motion. No thyromegaly present.  Cardiovascular: Normal rate and regular rhythm.   No murmur heard. Pulmonary/Chest: Effort normal and breath sounds normal. No respiratory distress. He has no wheezes. She has no rales. She exhibits no tenderness.  Abdominal: Soft. Bowel sounds are normal. She exhibits no distension and no mass. There is no tenderness. There is no rebound and no guarding.  Musculoskeletal: She exhibits no edema.  Lymphadenopathy:    She has no cervical adenopathy.  Neurological: She is alert and oriented to person, place, and time. She has normal patellar reflexes. She exhibits normal muscle tone. Coordination normal.  Skin: Skin is warm and dry.  Psychiatric: She has a normal mood and affect. Her behavior is normal. Judgment and thought content normal.  Breasts: Examined lying (bilateral breast implants are noted) Right: Without masses, retractions, discharge or axillary adenopathy.  Left: Without masses, retractions,  discharge or axillary adenopathy.  Inguinal/mons: Normal without inguinal adenopathy  External genitalia: Normal  BUS/Urethra/Skene's glands: Normal  Bladder: Normal  Vagina: Normal  Cervix: Normal  Uterus: normal in size, shape and contour. Midline and mobile  Adnexa/parametria:  Rt: Without masses or tenderness.  Lt: Without masses or tenderness.  Anus and perineum: Normal            Assessment & Plan:   Preventative care- continue healthy diet. Discussed importance of regular exercise with a goal of 30 minutes 5 days a week. Flu shot  today. Tetanus up to date. Pap performed.   Dysuria- will check UA/culture to rule out UTI.  Depression- notes that her energy has been low. I have advised her to restart wellbutrin. If her energy does not improve we can consider increasing the dose. She will send me an update via mychart.     Assessment & Plan:

## 2019-05-09 NOTE — Patient Instructions (Addendum)
Please complete lab work prior to leaving. Schedule vision and dental exams when you are able.  Restart wellbutrin and let me know how you are feeling in a few weeks (you can send me a mychart message).

## 2019-05-11 ENCOUNTER — Encounter: Payer: Self-pay | Admitting: Family

## 2019-05-11 LAB — URINE CULTURE
MICRO NUMBER:: 983795
SPECIMEN QUALITY:: ADEQUATE

## 2019-05-16 LAB — CYTOLOGY - PAP
Comment: NEGATIVE
Diagnosis: NEGATIVE
High risk HPV: NEGATIVE

## 2019-05-21 ENCOUNTER — Encounter: Payer: Self-pay | Admitting: Family

## 2019-05-22 MED ORDER — BUPROPION HCL ER (XL) 300 MG PO TB24: 300.0000 mg | ORAL_TABLET | Freq: Every day | ORAL | 0 refills | Status: AC

## 2019-06-20 ENCOUNTER — Ambulatory Visit: Payer: BC Managed Care – PPO | Admitting: Family

## 2019-06-26 ENCOUNTER — Encounter: Payer: Self-pay | Admitting: Family

## 2019-07-10 ENCOUNTER — Encounter: Payer: Self-pay | Admitting: Family

## 2019-07-11 MED ORDER — FLUOXETINE HCL 10 MG PO CAPS: 10.0000 mg | ORAL_CAPSULE | Freq: Every day | ORAL | 1 refills | Status: AC

## 2019-11-10 ENCOUNTER — Ambulatory Visit: Payer: BC Managed Care – PPO | Admitting: Family

## 2020-07-25 IMAGING — MR MR FOOT*L* WO/W CM
5 of 9 series · 25 of 40 positions shown · IV contrast (multihance)
Comparison: Left foot x-rays dated April 27, 2018.

CLINICAL DATA: Diffuse foot pain and swelling for the past month.
No known injury.

EXAM:
MRI OF THE LEFT FOREFOOT WITHOUT AND WITH CONTRAST
TECHNIQUE: Multiplanar, multisequence MR imaging of the left forefoot was
performed both before and after administration of intravenous
contrast.
CONTRAST:  15mL MULTIHANCE GADOBENATE DIMEGLUMINE 529 MG/ML IV SOLN

[Series 4: T2 fat-sat · coronal · 3.0mm · 0.55mm/px · 6 of 50 slices shown (1 of 2)]
[im 1/50]
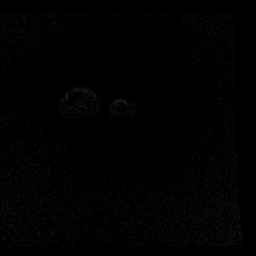
[im 10/50]
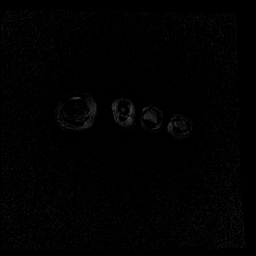
[im 20/50]
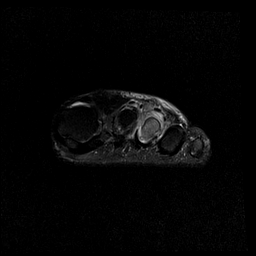
[im 30/50]
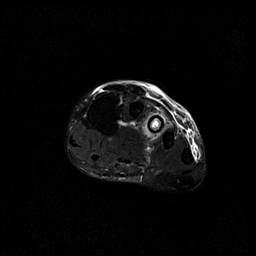
[im 40/50]
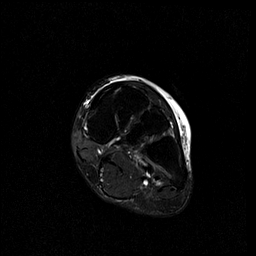
[im 50/50]
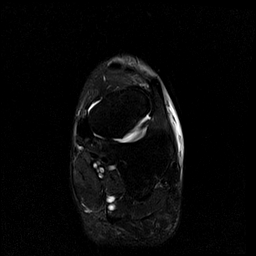

[Series 5: T1 · coronal · 3.0mm · 0.27mm/px · 6 of 50 slices shown (1 of 3)]
[im 1/50]
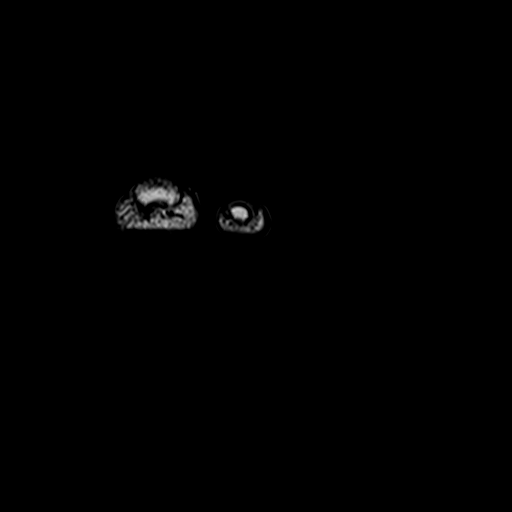
[im 10/50]
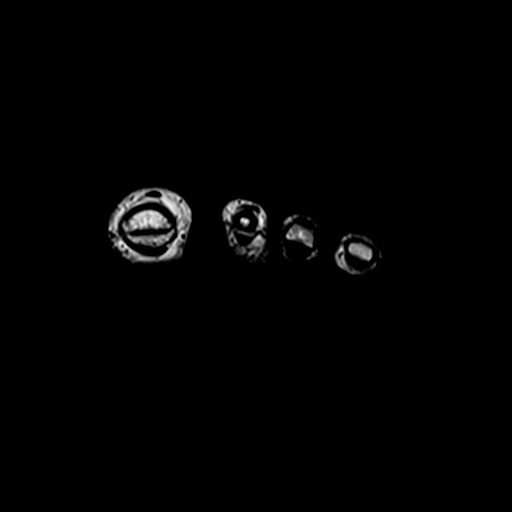
[im 20/50]
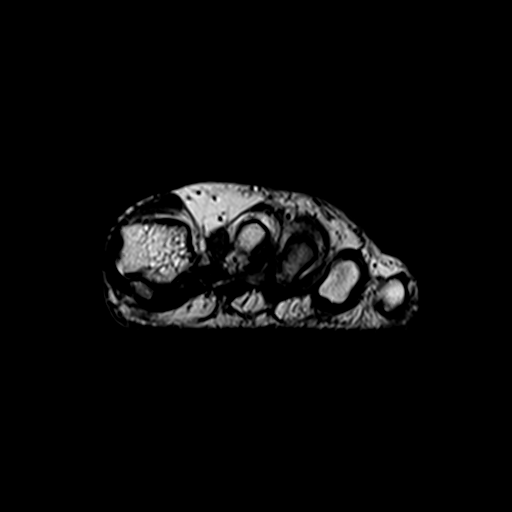
[im 30/50]
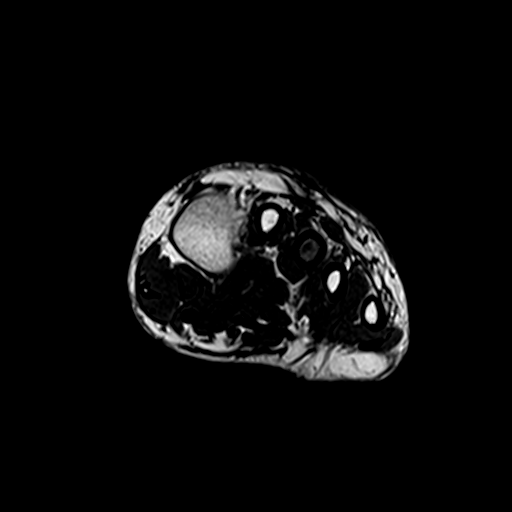
[im 40/50]
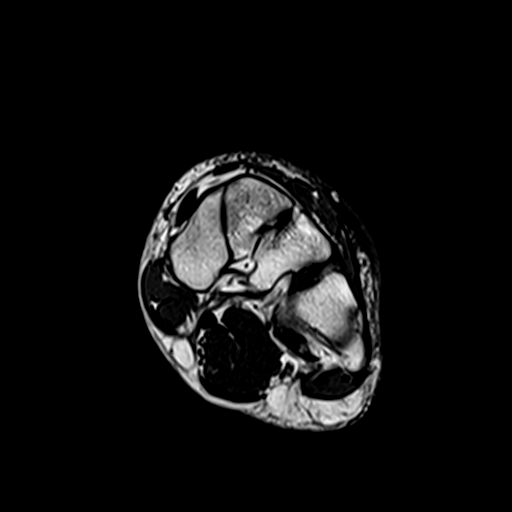
[im 50/50]
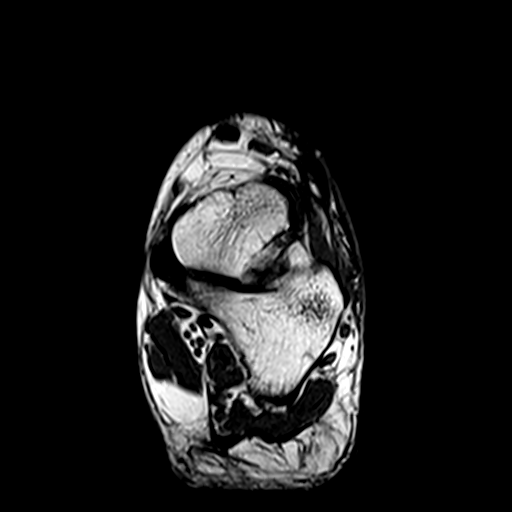

[Series 6: T1 · coronal · non-contrast · 3.0mm · 0.27mm/px · 7 of 50 slices shown (2 of 3)]
[im 1/50]
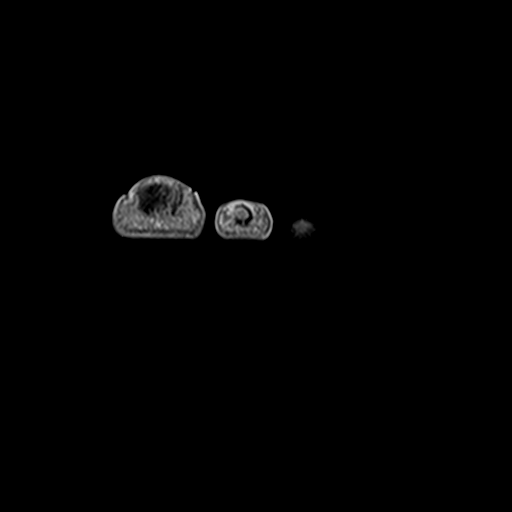
[im 9/50]
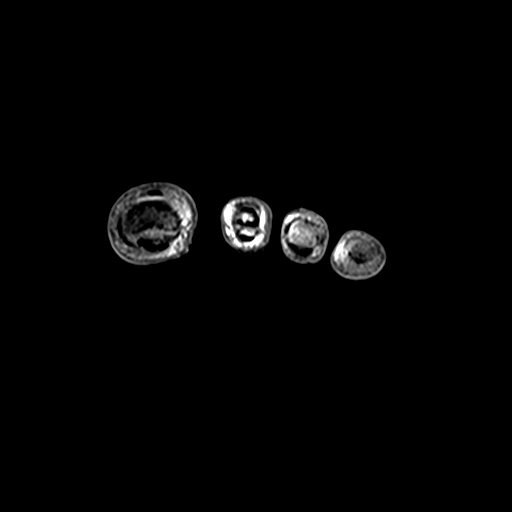
[im 17/50]
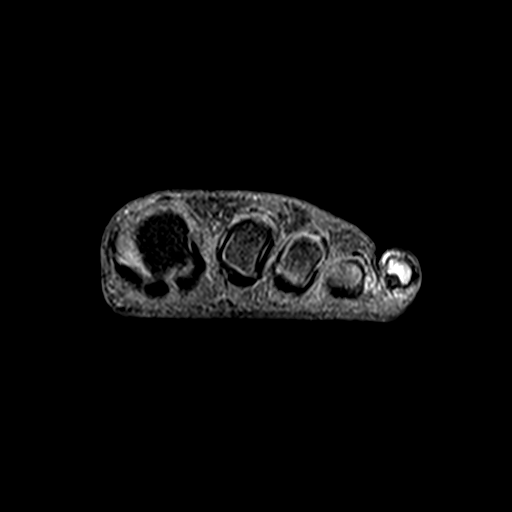
[im 25/50]
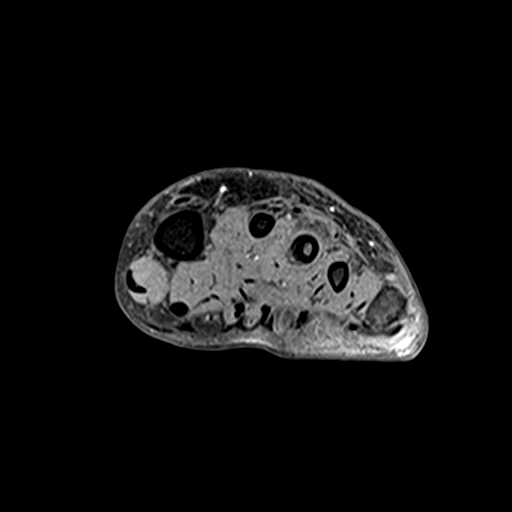
[im 33/50]
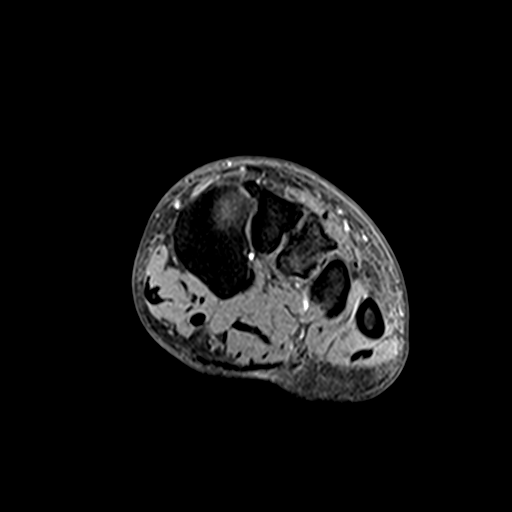
[im 41/50]
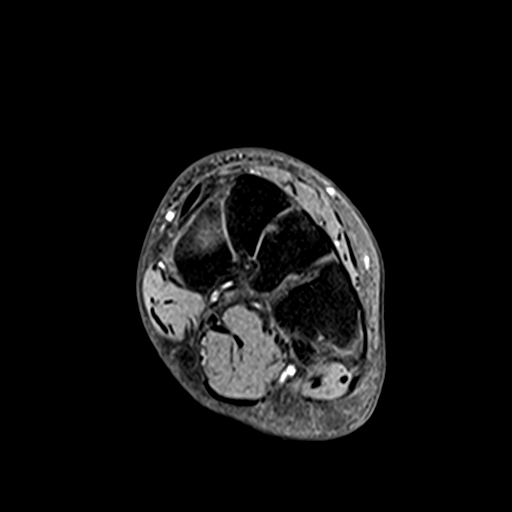
[im 50/50]
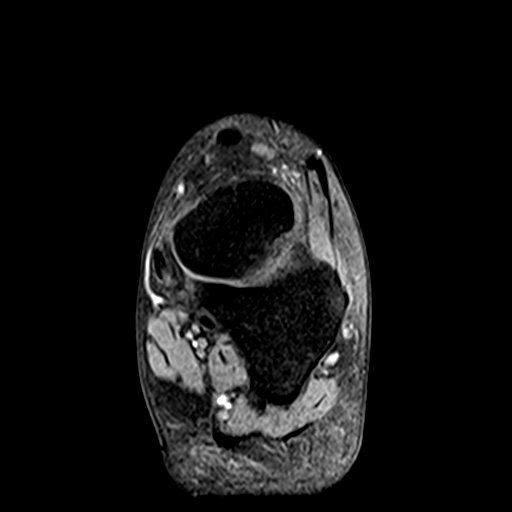

[Series 7: T2 fat-sat · axial · 3.0mm · 0.70mm/px · z∈[-106,-34]mm · 3 of 22 slices shown (2 of 2)]
[im 1/22]
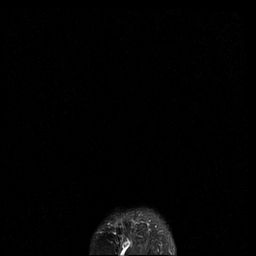
[im 11/22]
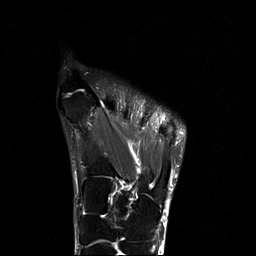
[im 22/22]
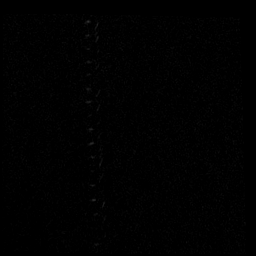

[Series 8: T1 · axial · 3.0mm · 0.35mm/px · z∈[-106,-38]mm · 3 of 21 slices shown (3 of 3)]
[im 1/21]
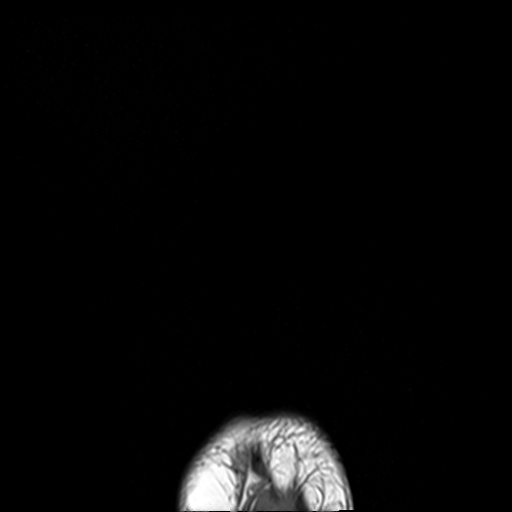
[im 11/21]
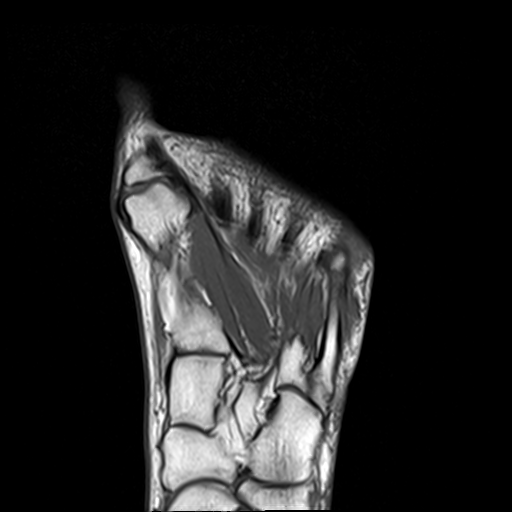
[im 21/21]
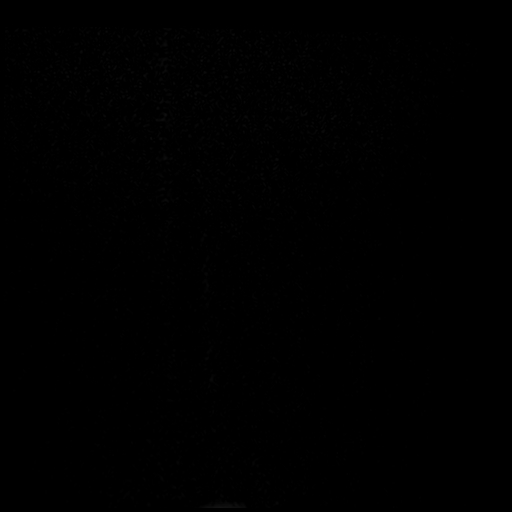

[25 of 40 positions shown; findings below may reference images not displayed]

FINDINGS: Bones/Joint/Cartilage

Extensive marrow edema involving the third metatarsal diaphysis with
prominent surrounding periosteal and soft tissue edema and
enhancement. There is a hypointense fracture line through the distal
third metatarsal diaphysis, best seen on the sagittal images (series
9, image 10). No dislocation. Normal alignment. No joint effusion.

Ligaments

Collateral ligaments are intact.  Lisfranc ligament is intact.

Muscles and Tendons
Flexor, peroneal and extensor compartment tendons are intact. No
muscle atrophy or edema.

Soft tissue
Mild dorsal forefoot soft tissue swelling. No fluid collection or
hematoma. No soft tissue mass.
IMPRESSION: 1. Stress fracture of the third metatarsal.
